# Patient Record
Sex: Female | Born: 1940 | Race: White | Hispanic: No | Marital: Married | Smoking: Former smoker
Health system: Southern US, Community
[De-identification: ages and names within clinical notes are randomized; demographics above are authoritative.]

## PROBLEM LIST (undated history)

## (undated) DIAGNOSIS — C801 Malignant (primary) neoplasm, unspecified: Secondary | ICD-10-CM

## (undated) DIAGNOSIS — Z87442 Personal history of urinary calculi: Secondary | ICD-10-CM

## (undated) DIAGNOSIS — M1990 Unspecified osteoarthritis, unspecified site: Secondary | ICD-10-CM

## (undated) DIAGNOSIS — K5790 Diverticulosis of intestine, part unspecified, without perforation or abscess without bleeding: Secondary | ICD-10-CM

## (undated) DIAGNOSIS — A4902 Methicillin resistant Staphylococcus aureus infection, unspecified site: Secondary | ICD-10-CM

## (undated) DIAGNOSIS — I499 Cardiac arrhythmia, unspecified: Secondary | ICD-10-CM

## (undated) DIAGNOSIS — N189 Chronic kidney disease, unspecified: Secondary | ICD-10-CM

## (undated) DIAGNOSIS — E7800 Pure hypercholesterolemia, unspecified: Secondary | ICD-10-CM

## (undated) DIAGNOSIS — K219 Gastro-esophageal reflux disease without esophagitis: Secondary | ICD-10-CM

## (undated) DIAGNOSIS — J302 Other seasonal allergic rhinitis: Secondary | ICD-10-CM

## (undated) DIAGNOSIS — Z973 Presence of spectacles and contact lenses: Secondary | ICD-10-CM

## (undated) DIAGNOSIS — I10 Essential (primary) hypertension: Secondary | ICD-10-CM

## (undated) DIAGNOSIS — Z972 Presence of dental prosthetic device (complete) (partial): Secondary | ICD-10-CM

## (undated) DIAGNOSIS — L0390 Cellulitis, unspecified: Secondary | ICD-10-CM

## (undated) DIAGNOSIS — M810 Age-related osteoporosis without current pathological fracture: Secondary | ICD-10-CM

## (undated) HISTORY — PX: COLONOSCOPY: SHX174

## (undated) HISTORY — PX: ABDOMINAL HYSTERECTOMY: SHX81

## (undated) HISTORY — PX: VEIN LIGATION: SHX2652

## (undated) HISTORY — PX: SKIN CANCER EXCISION: SHX779

## (undated) HISTORY — PX: CHOLECYSTECTOMY: SHX55

## (undated) HISTORY — PX: FOOT SURGERY: SHX648

---

## 1983-06-20 HISTORY — PX: BREAST BIOPSY: SHX20

## 1991-06-20 HISTORY — PX: BREAST CYST ASPIRATION: SHX578

## 2004-05-16 ENCOUNTER — Ambulatory Visit: Payer: Self-pay | Admitting: Internal Medicine

## 2004-11-18 ENCOUNTER — Ambulatory Visit: Payer: Self-pay | Admitting: Internal Medicine

## 2005-04-07 ENCOUNTER — Inpatient Hospital Stay: Payer: Self-pay | Admitting: Urology

## 2005-04-07 ENCOUNTER — Ambulatory Visit: Payer: Self-pay | Admitting: Urology

## 2005-04-25 ENCOUNTER — Ambulatory Visit: Payer: Self-pay | Admitting: Urology

## 2005-05-30 ENCOUNTER — Ambulatory Visit: Payer: Self-pay | Admitting: Internal Medicine

## 2005-06-19 DIAGNOSIS — C801 Malignant (primary) neoplasm, unspecified: Secondary | ICD-10-CM

## 2005-06-19 HISTORY — PX: NEPHRECTOMY: SHX65

## 2005-06-19 HISTORY — DX: Malignant (primary) neoplasm, unspecified: C80.1

## 2005-09-05 ENCOUNTER — Ambulatory Visit: Payer: Self-pay | Admitting: Internal Medicine

## 2005-12-15 ENCOUNTER — Ambulatory Visit: Payer: Self-pay | Admitting: Obstetrics and Gynecology

## 2006-06-04 ENCOUNTER — Ambulatory Visit: Payer: Self-pay | Admitting: Internal Medicine

## 2007-06-21 ENCOUNTER — Ambulatory Visit: Payer: Self-pay | Admitting: Internal Medicine

## 2007-06-27 ENCOUNTER — Ambulatory Visit: Payer: Self-pay | Admitting: Unknown Physician Specialty

## 2007-07-11 ENCOUNTER — Ambulatory Visit: Payer: Self-pay | Admitting: Internal Medicine

## 2008-07-14 ENCOUNTER — Ambulatory Visit: Payer: Self-pay | Admitting: Internal Medicine

## 2008-10-22 ENCOUNTER — Ambulatory Visit: Payer: Self-pay | Admitting: Internal Medicine

## 2009-03-03 ENCOUNTER — Inpatient Hospital Stay: Payer: Self-pay | Admitting: Podiatry

## 2009-03-03 DIAGNOSIS — A4902 Methicillin resistant Staphylococcus aureus infection, unspecified site: Secondary | ICD-10-CM

## 2009-03-03 HISTORY — DX: Methicillin resistant Staphylococcus aureus infection, unspecified site: A49.02

## 2009-07-15 ENCOUNTER — Ambulatory Visit: Payer: Self-pay | Admitting: Internal Medicine

## 2010-05-24 ENCOUNTER — Ambulatory Visit: Payer: Self-pay | Admitting: Internal Medicine

## 2010-07-26 ENCOUNTER — Ambulatory Visit: Payer: Self-pay | Admitting: Internal Medicine

## 2010-12-27 ENCOUNTER — Ambulatory Visit: Payer: Self-pay | Admitting: Unknown Physician Specialty

## 2011-08-03 ENCOUNTER — Ambulatory Visit: Payer: Self-pay | Admitting: Family Medicine

## 2012-08-29 ENCOUNTER — Ambulatory Visit: Payer: Self-pay | Admitting: Family Medicine

## 2013-04-28 DIAGNOSIS — C439 Malignant melanoma of skin, unspecified: Secondary | ICD-10-CM | POA: Insufficient documentation

## 2013-10-28 ENCOUNTER — Ambulatory Visit: Payer: Self-pay | Admitting: Family Medicine

## 2014-08-28 DIAGNOSIS — Z85820 Personal history of malignant melanoma of skin: Secondary | ICD-10-CM | POA: Insufficient documentation

## 2014-12-10 ENCOUNTER — Other Ambulatory Visit: Payer: Self-pay | Admitting: Family Medicine

## 2014-12-10 DIAGNOSIS — Z1231 Encounter for screening mammogram for malignant neoplasm of breast: Secondary | ICD-10-CM

## 2014-12-17 ENCOUNTER — Ambulatory Visit: Payer: Medicare Other

## 2014-12-17 ENCOUNTER — Ambulatory Visit
Admission: RE | Admit: 2014-12-17 | Discharge: 2014-12-17 | Disposition: A | Discharge status: Home / Self Care | Payer: Medicare Other | Source: Ambulatory Visit | Attending: Family Medicine | Admitting: Family Medicine

## 2014-12-17 DIAGNOSIS — Z1231 Encounter for screening mammogram for malignant neoplasm of breast: Secondary | ICD-10-CM | POA: Insufficient documentation

## 2014-12-17 HISTORY — DX: Malignant (primary) neoplasm, unspecified: C80.1

## 2015-01-29 ENCOUNTER — Encounter: Payer: Self-pay | Admitting: *Deleted

## 2015-02-02 NOTE — Discharge Instructions (Signed)

## 2015-02-03 ENCOUNTER — Ambulatory Visit
Admission: RE | Admit: 2015-02-03 | Discharge: 2015-02-03 | Disposition: A | Discharge status: Home / Self Care | Payer: Medicare Other | Source: Ambulatory Visit | Attending: Ophthalmology | Admitting: Ophthalmology

## 2015-02-03 ENCOUNTER — Ambulatory Visit: Payer: Medicare Other | Admitting: Anesthesiology

## 2015-02-03 ENCOUNTER — Encounter
Admission: RE | Disposition: A | Discharge status: Home / Self Care | Payer: Self-pay | Source: Ambulatory Visit | Attending: Ophthalmology

## 2015-02-03 DIAGNOSIS — Z85828 Personal history of other malignant neoplasm of skin: Secondary | ICD-10-CM | POA: Insufficient documentation

## 2015-02-03 DIAGNOSIS — H2512 Age-related nuclear cataract, left eye: Secondary | ICD-10-CM | POA: Insufficient documentation

## 2015-02-03 DIAGNOSIS — Z90710 Acquired absence of both cervix and uterus: Secondary | ICD-10-CM | POA: Diagnosis not present

## 2015-02-03 DIAGNOSIS — E780 Pure hypercholesterolemia: Secondary | ICD-10-CM | POA: Insufficient documentation

## 2015-02-03 DIAGNOSIS — Z905 Acquired absence of kidney: Secondary | ICD-10-CM | POA: Diagnosis not present

## 2015-02-03 DIAGNOSIS — J40 Bronchitis, not specified as acute or chronic: Secondary | ICD-10-CM | POA: Insufficient documentation

## 2015-02-03 DIAGNOSIS — Z881 Allergy status to other antibiotic agents status: Secondary | ICD-10-CM | POA: Diagnosis not present

## 2015-02-03 DIAGNOSIS — K5790 Diverticulosis of intestine, part unspecified, without perforation or abscess without bleeding: Secondary | ICD-10-CM | POA: Insufficient documentation

## 2015-02-03 DIAGNOSIS — M810 Age-related osteoporosis without current pathological fracture: Secondary | ICD-10-CM | POA: Insufficient documentation

## 2015-02-03 DIAGNOSIS — Z888 Allergy status to other drugs, medicaments and biological substances status: Secondary | ICD-10-CM | POA: Insufficient documentation

## 2015-02-03 DIAGNOSIS — Z85528 Personal history of other malignant neoplasm of kidney: Secondary | ICD-10-CM | POA: Diagnosis not present

## 2015-02-03 DIAGNOSIS — K219 Gastro-esophageal reflux disease without esophagitis: Secondary | ICD-10-CM | POA: Insufficient documentation

## 2015-02-03 HISTORY — DX: Gastro-esophageal reflux disease without esophagitis: K21.9

## 2015-02-03 HISTORY — PX: CATARACT EXTRACTION W/PHACO: SHX586

## 2015-02-03 HISTORY — DX: Age-related osteoporosis without current pathological fracture: M81.0

## 2015-02-03 HISTORY — DX: Pure hypercholesterolemia, unspecified: E78.00

## 2015-02-03 HISTORY — DX: Methicillin resistant Staphylococcus aureus infection, unspecified site: A49.02

## 2015-02-03 HISTORY — DX: Presence of spectacles and contact lenses: Z97.3

## 2015-02-03 HISTORY — DX: Essential (primary) hypertension: I10

## 2015-02-03 HISTORY — DX: Presence of dental prosthetic device (complete) (partial): Z97.2

## 2015-02-03 HISTORY — DX: Diverticulosis of intestine, part unspecified, without perforation or abscess without bleeding: K57.90

## 2015-02-03 HISTORY — DX: Other seasonal allergic rhinitis: J30.2

## 2015-02-03 HISTORY — DX: Unspecified osteoarthritis, unspecified site: M19.90

## 2015-02-03 SURGERY — PHACOEMULSIFICATION, CATARACT, WITH IOL INSERTION
Anesthesia: Monitor Anesthesia Care | Laterality: Left | Wound class: Clean

## 2015-02-03 MED ORDER — LIDOCAINE HCL 2 % EX GEL
1.0000 "application " | Freq: Once | CUTANEOUS | Status: AC
  Administered 2015-02-03: 5 via TOPICAL

## 2015-02-03 MED ORDER — NA HYALUR & NA CHOND-NA HYALUR 0.4-0.35 ML IO KIT
PACK | INTRAOCULAR | Status: DC | PRN
  Administered 2015-02-03: 1 mL via INTRAOCULAR

## 2015-02-03 MED ORDER — CEFUROXIME OPHTHALMIC INJECTION 1 MG/0.1 ML
INJECTION | OPHTHALMIC | Status: DC | PRN
  Administered 2015-02-03: 0.1 mL via OPHTHALMIC

## 2015-02-03 MED ORDER — LACTATED RINGERS IV SOLN: INTRAVENOUS | Status: DC

## 2015-02-03 MED ORDER — TIMOLOL MALEATE 0.5 % OP SOLN
OPHTHALMIC | Status: DC | PRN
  Administered 2015-02-03: 1 [drp] via OPHTHALMIC

## 2015-02-03 MED ORDER — POLYMYXIN B-TRIMETHOPRIM 10000-0.1 UNIT/ML-% OP SOLN
1.0000 [drp] | OPHTHALMIC | Status: DC | PRN
  Administered 2015-02-03 (×3): 1 [drp] via OPHTHALMIC

## 2015-02-03 MED ORDER — LIDOCAINE HCL (PF) 4 % IJ SOLN
INTRAMUSCULAR | Status: DC | PRN
  Administered 2015-02-03 (×2): 1 mL via OPHTHALMIC

## 2015-02-03 MED ORDER — BRIMONIDINE TARTRATE 0.2 % OP SOLN
OPHTHALMIC | Status: DC | PRN
  Administered 2015-02-03: 1 [drp] via OPHTHALMIC

## 2015-02-03 MED ORDER — EPINEPHRINE HCL 1 MG/ML IJ SOLN
INTRAOCULAR | Status: DC | PRN
  Administered 2015-02-03: 78 mL via OPHTHALMIC

## 2015-02-03 MED ORDER — POVIDONE-IODINE 5 % OP SOLN
1.0000 "application " | OPHTHALMIC | Status: DC | PRN
  Administered 2015-02-03: 1 via OPHTHALMIC

## 2015-02-03 MED ORDER — PHENYLEPHRINE HCL 10 % OP SOLN
1.0000 [drp] | OPHTHALMIC | Status: DC | PRN
  Administered 2015-02-03 (×4): 1 [drp] via OPHTHALMIC

## 2015-02-03 MED ORDER — ACETAMINOPHEN 160 MG/5ML PO SOLN: 325.0000 mg | ORAL | Status: DC | PRN

## 2015-02-03 MED ORDER — ACETAMINOPHEN 325 MG PO TABS: 325.0000 mg | ORAL_TABLET | ORAL | Status: DC | PRN

## 2015-02-03 MED ORDER — MIDAZOLAM HCL 2 MG/2ML IJ SOLN
INTRAMUSCULAR | Status: DC | PRN
  Administered 2015-02-03: 2 mg via INTRAVENOUS

## 2015-02-03 MED ORDER — TETRACAINE HCL 0.5 % OP SOLN
1.0000 [drp] | OPHTHALMIC | Status: DC | PRN
  Administered 2015-02-03: 1 [drp] via OPHTHALMIC

## 2015-02-03 MED ORDER — CYCLOPENTOLATE HCL 2 % OP SOLN
1.0000 [drp] | OPHTHALMIC | Status: DC | PRN
  Administered 2015-02-03 (×4): 1 [drp] via OPHTHALMIC

## 2015-02-03 MED ORDER — FENTANYL CITRATE (PF) 100 MCG/2ML IJ SOLN
INTRAMUSCULAR | Status: DC | PRN
  Administered 2015-02-03: 50 ug via INTRAVENOUS

## 2015-02-03 SURGICAL SUPPLY — 25 items
CANNULA ANT/CHMB 27GA (MISCELLANEOUS) ×3 IMPLANT
GLOVE SURG LX 7.5 STRW (GLOVE) ×2
GLOVE SURG LX STRL 7.5 STRW (GLOVE) ×1 IMPLANT
GLOVE SURG TRIUMPH 8.0 PF LTX (GLOVE) ×3 IMPLANT
GOWN STRL REUS W/ TWL LRG LVL3 (GOWN DISPOSABLE) ×2 IMPLANT
GOWN STRL REUS W/TWL LRG LVL3 (GOWN DISPOSABLE) ×4
LENS IOL TECNIS 12.5 (Intraocular Lens) ×3 IMPLANT
MARKER SKIN SURG W/RULER VIO (MISCELLANEOUS) ×3 IMPLANT
NDL RETROBULBAR .5 NSTRL (NEEDLE) IMPLANT
NEEDLE FILTER BLUNT 18X 1/2SAF (NEEDLE) ×2
NEEDLE FILTER BLUNT 18X1 1/2 (NEEDLE) ×1 IMPLANT
PACK CATARACT BRASINGTON (MISCELLANEOUS) ×3 IMPLANT
PACK EYE AFTER SURG (MISCELLANEOUS) ×3 IMPLANT
PACK OPTHALMIC (MISCELLANEOUS) ×3 IMPLANT
RING MALYGIN 7.0 (MISCELLANEOUS) IMPLANT
SUT ETHILON 10-0 CS-B-6CS-B-6 (SUTURE)
SUT VICRYL  9 0 (SUTURE)
SUT VICRYL 9 0 (SUTURE) IMPLANT
SUTURE EHLN 10-0 CS-B-6CS-B-6 (SUTURE) IMPLANT
SYR 3ML LL SCALE MARK (SYRINGE) ×3 IMPLANT
SYR 5ML LL (SYRINGE) IMPLANT
SYR TB 1ML LUER SLIP (SYRINGE) ×3 IMPLANT
WATER STERILE IRR 250ML POUR (IV SOLUTION) ×3 IMPLANT
WATER STERILE IRR 500ML POUR (IV SOLUTION) IMPLANT
WIPE NON LINTING 3.25X3.25 (MISCELLANEOUS) ×3 IMPLANT

## 2015-02-03 NOTE — H&P (Signed)
The History and Physical notes were scanned in.  The patient remains stable and unchanged from the H&P.   Previous H&P reviewed, patient examined, and there are no changes.  Shawnell Dykes 02/03/2015 12:02 PM

## 2015-02-03 NOTE — Addendum Note (Signed)
Addendum  created 02/03/15 1216 by Ronelle Nigh, MD   Modules edited: Notes Section   Notes Section:  File: [id number]

## 2015-02-03 NOTE — Op Note (Signed)
OPERATIVE NOTE  Carolyn Frye [id number] 02/03/2015   PREOPERATIVE DIAGNOSIS:  Nuclear sclerotic cataract left eye. H25.12   POSTOPERATIVE DIAGNOSIS:    Nuclear sclerotic cataract left eye.     PROCEDURE:  Phacoemusification with posterior chamber intraocular lens placement of the left eye   LENS:   Implant Name Type Inv. Item Serial No. Manufacturer Lot No. LRB No. Used  LENS IMPL INTRAOC ZCB00 12.5 - [id number] Intraocular Lens LENS IMPL INTRAOC ZCB00 12.5 [id number] AMO   Left 1        ULTRASOUND TIME: 10  % of 1 minutes 5 seconds, CDE 6.7  SURGEON:  Wyonia Hough, MD   ANESTHESIA:  Topical with tetracaine drops and 2% Xylocaine jelly, augmented with 1% preservative-free intracameral lidocaine.    COMPLICATIONS:  None.   DESCRIPTION OF PROCEDURE:  The patient was identified in the holding room and transported to the operating room and placed in the supine position under the operating microscope.  The left eye was identified as the operative eye and it was prepped and draped in the usual sterile ophthalmic fashion.   A 1 millimeter clear-corneal paracentesis was made at the 1:30 position.  0.5 ml of preservative-free 1% lidocaine was injected into the anterior chamber. The anterior chamber was filled with Viscoat viscoelastic.  A 2.4 millimeter keratome was used to make a near-clear corneal incision at the 10:30 position.  .  A curvilinear capsulorrhexis was made with a cystotome and capsulorrhexis forceps.  Balanced salt solution was used to hydrodissect and hydrodelineate the nucleus.   Phacoemulsification was then used in stop and chop fashion to remove the lens nucleus and epinucleus.  The remaining cortex was then removed using the irrigation and aspiration handpiece. Provisc was then placed into the capsular bag to distend it for lens placement.  A lens was then injected into the capsular bag.  The remaining viscoelastic was aspirated.   Wounds were hydrated  with balanced salt solution.  The anterior chamber was inflated to a physiologic pressure with balanced salt solution.  No wound leaks were noted. Cefuroxime 0.1 ml of a 10mg /ml solution was injected into the anterior chamber for a dose of 1 mg of intracameral antibiotic at the completion of the case.   Timolol and Brimonidine drops were applied to the eye.  The patient was taken to the recovery room in stable condition without complications of anesthesia or surgery.  [name] 02/03/2015, 11:32 AM

## 2015-02-03 NOTE — Transfer of Care (Signed)
Immediate Anesthesia Transfer of Care Note  Patient: Carolyn Frye  Procedure(s) Performed: Procedure(s): CATARACT EXTRACTION PHACO AND INTRAOCULAR LENS PLACEMENT (IOC) (Left)  Patient Location: PACU  Anesthesia Type: MAC  Level of Consciousness: awake, alert  and patient cooperative  Airway and Oxygen Therapy: Patient Spontanous Breathing and Patient connected to supplemental oxygen  Post-op Assessment: Post-op Vital signs reviewed, Patient's Cardiovascular Status Stable, Respiratory Function Stable, Patent Airway and No signs of Nausea or vomiting  Post-op Vital Signs: Reviewed and stable  Complications: No apparent anesthesia complications

## 2015-02-03 NOTE — Anesthesia Postprocedure Evaluation (Addendum)
  Anesthesia Post-op Note  Patient: Carolyn Frye  Procedure(s) Performed: Procedure(s): CATARACT EXTRACTION PHACO AND INTRAOCULAR LENS PLACEMENT (IOC) (Left)  Anesthesia type:MAC  Patient location: PACU  Post pain: Pain level controlled  Post assessment: Post-op Vital signs reviewed, Patient's Cardiovascular Status Stable, Respiratory Function Stable, Patent Airway and No signs of Nausea or vomiting  Post vital signs: Reviewed and stable  Last Vitals:  Filed Vitals:   02/03/15 1140  BP: 122/71  Pulse: 74  Temp:   Resp: 16    Level of consciousness: awake, alert  and patient cooperative  Complications: No apparent anesthesia complications

## 2015-02-03 NOTE — Anesthesia Procedure Notes (Signed)
Procedure Name: MAC Performed by: Star Resler Pre-anesthesia Checklist: Patient identified, Emergency Drugs available, Suction available, Timeout performed and Patient being monitored Patient Re-evaluated:Patient Re-evaluated prior to inductionOxygen Delivery Method: Nasal cannula Placement Confirmation: positive ETCO2       

## 2015-02-03 NOTE — Anesthesia Preprocedure Evaluation (Signed)
Anesthesia Evaluation  Patient identified by MRN, date of birth, ID band  Reviewed: Allergy & Precautions, H&P , NPO status , Patient's Chart, lab work & pertinent test results  Airway Mallampati: III  TM Distance: >3 FB Neck ROM: full    Dental no notable dental hx.    Pulmonary    Pulmonary exam normal       Cardiovascular hypertension, Rhythm:regular Rate:Normal     Neuro/Psych    GI/Hepatic GERD-  ,  Endo/Other    Renal/GU      Musculoskeletal   Abdominal   Peds  Hematology   Anesthesia Other Findings   Reproductive/Obstetrics                             Anesthesia Physical Anesthesia Plan  ASA: II  Anesthesia Plan: MAC   Post-op Pain Management:    Induction:   Airway Management Planned:   Additional Equipment:   Intra-op Plan:   Post-operative Plan:   Informed Consent: I have reviewed the patients History and Physical, chart, labs and discussed the procedure including the risks, benefits and alternatives for the proposed anesthesia with the patient or authorized representative who has indicated his/her understanding and acceptance.     Plan Discussed with: CRNA  Anesthesia Plan Comments:         Anesthesia Quick Evaluation

## 2015-02-04 ENCOUNTER — Encounter: Payer: Self-pay | Admitting: Ophthalmology

## 2015-03-04 ENCOUNTER — Encounter: Payer: Self-pay | Admitting: *Deleted

## 2015-03-09 NOTE — Discharge Instructions (Signed)

## 2015-03-10 ENCOUNTER — Ambulatory Visit
Admission: RE | Admit: 2015-03-10 | Discharge: 2015-03-10 | Disposition: A | Discharge status: Home / Self Care | Payer: Medicare Other | Source: Ambulatory Visit | Attending: Ophthalmology | Admitting: Ophthalmology

## 2015-03-10 ENCOUNTER — Ambulatory Visit: Payer: Medicare Other | Admitting: Student in an Organized Health Care Education/Training Program

## 2015-03-10 ENCOUNTER — Encounter: Payer: Self-pay | Admitting: *Deleted

## 2015-03-10 ENCOUNTER — Encounter
Admission: RE | Disposition: A | Discharge status: Home / Self Care | Payer: Self-pay | Source: Ambulatory Visit | Attending: Ophthalmology

## 2015-03-10 DIAGNOSIS — Z9049 Acquired absence of other specified parts of digestive tract: Secondary | ICD-10-CM | POA: Insufficient documentation

## 2015-03-10 DIAGNOSIS — K219 Gastro-esophageal reflux disease without esophagitis: Secondary | ICD-10-CM | POA: Diagnosis not present

## 2015-03-10 DIAGNOSIS — K5790 Diverticulosis of intestine, part unspecified, without perforation or abscess without bleeding: Secondary | ICD-10-CM | POA: Diagnosis not present

## 2015-03-10 DIAGNOSIS — Z905 Acquired absence of kidney: Secondary | ICD-10-CM | POA: Diagnosis not present

## 2015-03-10 DIAGNOSIS — Z85528 Personal history of other malignant neoplasm of kidney: Secondary | ICD-10-CM | POA: Insufficient documentation

## 2015-03-10 DIAGNOSIS — M1990 Unspecified osteoarthritis, unspecified site: Secondary | ICD-10-CM | POA: Insufficient documentation

## 2015-03-10 DIAGNOSIS — M810 Age-related osteoporosis without current pathological fracture: Secondary | ICD-10-CM | POA: Diagnosis not present

## 2015-03-10 DIAGNOSIS — Z79899 Other long term (current) drug therapy: Secondary | ICD-10-CM | POA: Insufficient documentation

## 2015-03-10 DIAGNOSIS — J40 Bronchitis, not specified as acute or chronic: Secondary | ICD-10-CM | POA: Diagnosis not present

## 2015-03-10 DIAGNOSIS — H2511 Age-related nuclear cataract, right eye: Secondary | ICD-10-CM | POA: Insufficient documentation

## 2015-03-10 DIAGNOSIS — Z85820 Personal history of malignant melanoma of skin: Secondary | ICD-10-CM | POA: Insufficient documentation

## 2015-03-10 DIAGNOSIS — Z888 Allergy status to other drugs, medicaments and biological substances status: Secondary | ICD-10-CM | POA: Diagnosis not present

## 2015-03-10 DIAGNOSIS — Z881 Allergy status to other antibiotic agents status: Secondary | ICD-10-CM | POA: Diagnosis not present

## 2015-03-10 DIAGNOSIS — E780 Pure hypercholesterolemia: Secondary | ICD-10-CM | POA: Insufficient documentation

## 2015-03-10 DIAGNOSIS — Z90710 Acquired absence of both cervix and uterus: Secondary | ICD-10-CM | POA: Insufficient documentation

## 2015-03-10 DIAGNOSIS — I10 Essential (primary) hypertension: Secondary | ICD-10-CM | POA: Insufficient documentation

## 2015-03-10 HISTORY — PX: CATARACT EXTRACTION W/PHACO: SHX586

## 2015-03-10 SURGERY — PHACOEMULSIFICATION, CATARACT, WITH IOL INSERTION
Anesthesia: Monitor Anesthesia Care | Laterality: Right | Wound class: Clean

## 2015-03-10 MED ORDER — DEXAMETHASONE SODIUM PHOSPHATE 4 MG/ML IJ SOLN: 8.0000 mg | Freq: Once | INTRAMUSCULAR | Status: DC | PRN

## 2015-03-10 MED ORDER — POVIDONE-IODINE 5 % OP SOLN
1.0000 "application " | OPHTHALMIC | Status: DC | PRN
  Administered 2015-03-10: 1 via OPHTHALMIC

## 2015-03-10 MED ORDER — ACETAMINOPHEN 325 MG PO TABS: 325.0000 mg | ORAL_TABLET | ORAL | Status: DC | PRN

## 2015-03-10 MED ORDER — LIDOCAINE HCL 2 % EX GEL
1.0000 "application " | Freq: Once | CUTANEOUS | Status: AC
  Administered 2015-03-10: 1 via TOPICAL

## 2015-03-10 MED ORDER — BRIMONIDINE TARTRATE 0.2 % OP SOLN
OPHTHALMIC | Status: DC | PRN
  Administered 2015-03-10: 1 [drp] via OPHTHALMIC

## 2015-03-10 MED ORDER — NA HYALUR & NA CHOND-NA HYALUR 0.4-0.35 ML IO KIT
PACK | INTRAOCULAR | Status: DC | PRN
  Administered 2015-03-10: 1 mL via INTRAOCULAR

## 2015-03-10 MED ORDER — CYCLOPENTOLATE HCL 2 % OP SOLN
1.0000 [drp] | OPHTHALMIC | Status: DC | PRN
  Administered 2015-03-10 (×4): 1 [drp] via OPHTHALMIC

## 2015-03-10 MED ORDER — PHENYLEPHRINE HCL 10 % OP SOLN
1.0000 [drp] | OPHTHALMIC | Status: DC | PRN
  Administered 2015-03-10 (×4): 1 [drp] via OPHTHALMIC

## 2015-03-10 MED ORDER — ACETAMINOPHEN 160 MG/5ML PO SOLN: 325.0000 mg | ORAL | Status: DC | PRN

## 2015-03-10 MED ORDER — CEFUROXIME OPHTHALMIC INJECTION 1 MG/0.1 ML
INJECTION | OPHTHALMIC | Status: DC | PRN
  Administered 2015-03-10: 0.1 mL via INTRACAMERAL

## 2015-03-10 MED ORDER — TIMOLOL MALEATE 0.5 % OP SOLN
OPHTHALMIC | Status: DC | PRN
  Administered 2015-03-10: 1 [drp] via OPHTHALMIC

## 2015-03-10 MED ORDER — PROPARACAINE HCL 0.5 % OP SOLN
1.0000 [drp] | Freq: Once | OPHTHALMIC | Status: AC
  Administered 2015-03-10: 1 [drp] via OPHTHALMIC

## 2015-03-10 MED ORDER — LACTATED RINGERS IV SOLN: 500.0000 mL | INTRAVENOUS | Status: DC

## 2015-03-10 MED ORDER — BALANCED SALT IO SOLN
INTRAOCULAR | Status: DC | PRN
  Administered 2015-03-10: .5 mL via OPHTHALMIC

## 2015-03-10 MED ORDER — FENTANYL CITRATE (PF) 100 MCG/2ML IJ SOLN
INTRAMUSCULAR | Status: DC | PRN
  Administered 2015-03-10: 100 ug via INTRAVENOUS

## 2015-03-10 MED ORDER — MIDAZOLAM HCL 2 MG/2ML IJ SOLN
INTRAMUSCULAR | Status: DC | PRN
  Administered 2015-03-10: 2 mg via INTRAVENOUS

## 2015-03-10 MED ORDER — LACTATED RINGERS IV SOLN: INTRAVENOUS | Status: DC

## 2015-03-10 MED ORDER — POLYMYXIN B-TRIMETHOPRIM 10000-0.1 UNIT/ML-% OP SOLN
1.0000 [drp] | OPHTHALMIC | Status: DC | PRN
  Administered 2015-03-10 (×3): 1 [drp] via OPHTHALMIC

## 2015-03-10 MED ORDER — EPINEPHRINE HCL 1 MG/ML IJ SOLN
INTRAOCULAR | Status: DC | PRN
  Administered 2015-03-10: 75 mL via OPHTHALMIC

## 2015-03-10 SURGICAL SUPPLY — 26 items
CANNULA ANT/CHMB 27GA (MISCELLANEOUS) ×3 IMPLANT
GLOVE SURG LX 7.5 STRW (GLOVE) ×2
GLOVE SURG LX STRL 7.5 STRW (GLOVE) ×1 IMPLANT
GLOVE SURG TRIUMPH 8.0 PF LTX (GLOVE) ×3 IMPLANT
GOWN STRL REUS W/ TWL LRG LVL3 (GOWN DISPOSABLE) ×2 IMPLANT
GOWN STRL REUS W/TWL LRG LVL3 (GOWN DISPOSABLE) ×4
LENS IOL TECNIS 13.5 (Intraocular Lens) ×3 IMPLANT
LENS IOL TECNIS MONO 1P 13.5 (Intraocular Lens) ×1 IMPLANT
MARKER SKIN SURG W/RULER VIO (MISCELLANEOUS) ×3 IMPLANT
NDL RETROBULBAR .5 NSTRL (NEEDLE) IMPLANT
NEEDLE FILTER BLUNT 18X 1/2SAF (NEEDLE) ×2
NEEDLE FILTER BLUNT 18X1 1/2 (NEEDLE) ×1 IMPLANT
PACK CATARACT BRASINGTON (MISCELLANEOUS) ×3 IMPLANT
PACK EYE AFTER SURG (MISCELLANEOUS) ×3 IMPLANT
PACK OPTHALMIC (MISCELLANEOUS) ×3 IMPLANT
RING MALYGIN 7.0 (MISCELLANEOUS) IMPLANT
SUT ETHILON 10-0 CS-B-6CS-B-6 (SUTURE)
SUT VICRYL  9 0 (SUTURE)
SUT VICRYL 9 0 (SUTURE) IMPLANT
SUTURE EHLN 10-0 CS-B-6CS-B-6 (SUTURE) IMPLANT
SYR 3ML LL SCALE MARK (SYRINGE) ×3 IMPLANT
SYR 5ML LL (SYRINGE) IMPLANT
SYR TB 1ML LUER SLIP (SYRINGE) ×3 IMPLANT
WATER STERILE IRR 250ML POUR (IV SOLUTION) ×3 IMPLANT
WATER STERILE IRR 500ML POUR (IV SOLUTION) IMPLANT
WIPE NON LINTING 3.25X3.25 (MISCELLANEOUS) ×3 IMPLANT

## 2015-03-10 NOTE — Anesthesia Postprocedure Evaluation (Signed)
  Anesthesia Post-op Note  Patient: Carolyn Frye  Procedure(s) Performed: Procedure(s): CATARACT EXTRACTION PHACO AND INTRAOCULAR LENS PLACEMENT (IOC) (Right)  Anesthesia type:MAC  Patient location: PACU  Post pain: Pain level controlled  Post assessment: Post-op Vital signs reviewed, Patient's Cardiovascular Status Stable, Respiratory Function Stable, Patent Airway and No signs of Nausea or vomiting  Post vital signs: Reviewed and stable  Last Vitals:  Filed Vitals:   03/10/15 1044  BP:   Pulse: 65  Temp:   Resp: 17    Level of consciousness: awake, alert  and patient cooperative  Complications: No apparent anesthesia complications

## 2015-03-10 NOTE — H&P (Signed)
  The History and Physical notes were scanned in.  The patient remains stable and unchanged from the H&P.   Previous H&P reviewed, patient examined, and there are no changes.  Frye,Carolyn 03/10/2015 9:56 AM

## 2015-03-10 NOTE — Op Note (Signed)
LOCATION:  [place]   PREOPERATIVE DIAGNOSIS:    Nuclear sclerotic cataract right eye. H25.11   POSTOPERATIVE DIAGNOSIS:  Nuclear sclerotic cataract right eye.     PROCEDURE:  Phacoemusification with posterior chamber intraocular lens placement of the right eye   LENS:   Implant Name Type Inv. Item Serial No. Manufacturer Lot No. LRB No. Used  LENS IOL TECNIS 13.5 - [id number] Intraocular Lens LENS IOL TECNIS 13.5 [id number] AMO   Right 1        ULTRASOUND TIME: 11 % of 1 minutes, 36 seconds.  CDE 11.1   SURGEON:  Wyonia Hough, MD   ANESTHESIA:  Topical with tetracaine drops and 2% Xylocaine jelly, augmented with 1% preservative-free intracameral lidocaine.    COMPLICATIONS:  None.   DESCRIPTION OF PROCEDURE:  The patient was identified in the holding room and transported to the operating room and placed in the supine position under the operating microscope.  The right eye was identified as the operative eye and it was prepped and draped in the usual sterile ophthalmic fashion.   A 1 millimeter clear-corneal paracentesis was made at the 12:00 position.  0.5 ml of preservative-free 1% lidocaine was injected into the anterior chamber. The anterior chamber was filled with Viscoat viscoelastic.  A 2.4 millimeter keratome was used to make a near-clear corneal incision at the 9:00 position.  A curvilinear capsulorrhexis was made with a cystotome and capsulorrhexis forceps.  Balanced salt solution was used to hydrodissect and hydrodelineate the nucleus.   Phacoemulsification was then used in stop and chop fashion to remove the lens nucleus and epinucleus.  The remaining cortex was then removed using the irrigation and aspiration handpiece. Provisc was then placed into the capsular bag to distend it for lens placement.  A lens was then injected into the capsular bag.  The remaining viscoelastic was aspirated.   Wounds were hydrated with balanced salt solution.  The  anterior chamber was inflated to a physiologic pressure with balanced salt solution.  No wound leaks were noted. Cefuroxime 0.1 ml of a 10mg /ml solution was injected into the anterior chamber for a dose of 1 mg of intracameral antibiotic at the completion of the case.   Timolol and Brimonidine drops were applied to the eye.  The patient was taken to the recovery room in stable condition without complications of anesthesia or surgery.   BRASINGTON,CHADWICK 03/10/2015, 10:39 AM

## 2015-03-10 NOTE — Anesthesia Preprocedure Evaluation (Signed)
Anesthesia Evaluation  Patient identified by MRN, date of birth, ID band Patient awake    Reviewed: Allergy & Precautions, H&P , NPO status , Patient's Chart, lab work & pertinent test results, reviewed documented beta blocker date and time   Airway Mallampati: II  TM Distance: >3 FB Neck ROM: full    Dental no notable dental hx.    Pulmonary neg pulmonary ROS,    Pulmonary exam normal breath sounds clear to auscultation       Cardiovascular Exercise Tolerance: Good hypertension,  Rhythm:regular Rate:Normal     Neuro/Psych negative neurological ROS  negative psych ROS   GI/Hepatic Neg liver ROS, GERD  Medicated,  Endo/Other  negative endocrine ROS  Renal/GU negative Renal ROS  negative genitourinary   Musculoskeletal   Abdominal   Peds  Hematology negative hematology ROS (+)   Anesthesia Other Findings   Reproductive/Obstetrics negative OB ROS                             Anesthesia Physical Anesthesia Plan  ASA: II  Anesthesia Plan: MAC   Post-op Pain Management:    Induction:   Airway Management Planned:   Additional Equipment:   Intra-op Plan:   Post-operative Plan:   Informed Consent: I have reviewed the patients History and Physical, chart, labs and discussed the procedure including the risks, benefits and alternatives for the proposed anesthesia with the patient or authorized representative who has indicated his/her understanding and acceptance.     Plan Discussed with: CRNA  Anesthesia Plan Comments:         Anesthesia Quick Evaluation

## 2015-03-10 NOTE — Transfer of Care (Signed)
Immediate Anesthesia Transfer of Care Note  Patient: Carolyn Frye  Procedure(s) Performed: Procedure(s): CATARACT EXTRACTION PHACO AND INTRAOCULAR LENS PLACEMENT (IOC) (Right)  Patient Location: PACU  Anesthesia Type: MAC  Level of Consciousness: awake, alert  and patient cooperative  Airway and Oxygen Therapy: Patient Spontanous Breathing and Patient connected to supplemental oxygen  Post-op Assessment: Post-op Vital signs reviewed, Patient's Cardiovascular Status Stable, Respiratory Function Stable, Patent Airway and No signs of Nausea or vomiting  Post-op Vital Signs: Reviewed and stable  Complications: No apparent anesthesia complications

## 2015-03-10 NOTE — Anesthesia Procedure Notes (Signed)
Procedure Name: MAC Performed by: AMYOT, MICHAEL Pre-anesthesia Checklist: Patient identified, Emergency Drugs available, Suction available, Timeout performed and Patient being monitored Patient Re-evaluated:Patient Re-evaluated prior to inductionOxygen Delivery Method: Nasal cannula Placement Confirmation: positive ETCO2       

## 2015-03-11 ENCOUNTER — Encounter: Payer: Self-pay | Admitting: Ophthalmology

## 2016-03-02 ENCOUNTER — Other Ambulatory Visit: Payer: Self-pay | Admitting: Family Medicine

## 2016-03-02 DIAGNOSIS — Z1231 Encounter for screening mammogram for malignant neoplasm of breast: Secondary | ICD-10-CM

## 2016-03-09 ENCOUNTER — Other Ambulatory Visit: Payer: Self-pay | Admitting: Family Medicine

## 2016-03-09 ENCOUNTER — Ambulatory Visit
Admission: RE | Admit: 2016-03-09 | Discharge: 2016-03-09 | Disposition: A | Discharge status: Home / Self Care | Payer: Medicare Other | Source: Ambulatory Visit | Attending: Family Medicine | Admitting: Family Medicine

## 2016-03-09 DIAGNOSIS — Z1231 Encounter for screening mammogram for malignant neoplasm of breast: Secondary | ICD-10-CM

## 2016-09-04 DIAGNOSIS — Z0000 Encounter for general adult medical examination without abnormal findings: Secondary | ICD-10-CM | POA: Insufficient documentation

## 2016-09-21 ENCOUNTER — Emergency Department: Payer: Medicare Other

## 2016-09-21 ENCOUNTER — Encounter: Payer: Self-pay | Admitting: *Deleted

## 2016-09-21 ENCOUNTER — Inpatient Hospital Stay
Admission: EM | Admit: 2016-09-21 | Discharge: 2016-09-22 | DRG: 313 | Disposition: A | Discharge status: Home / Self Care | Payer: Medicare Other | Attending: Internal Medicine | Admitting: Internal Medicine

## 2016-09-21 ENCOUNTER — Inpatient Hospital Stay
Admit: 2016-09-21 | Discharge: 2016-09-21 | Disposition: A | Discharge status: Home / Self Care | Payer: Medicare Other | Attending: Internal Medicine | Admitting: Internal Medicine

## 2016-09-21 DIAGNOSIS — Z79899 Other long term (current) drug therapy: Secondary | ICD-10-CM

## 2016-09-21 DIAGNOSIS — Z9049 Acquired absence of other specified parts of digestive tract: Secondary | ICD-10-CM

## 2016-09-21 DIAGNOSIS — K219 Gastro-esophageal reflux disease without esophagitis: Secondary | ICD-10-CM | POA: Diagnosis not present

## 2016-09-21 DIAGNOSIS — Z85528 Personal history of other malignant neoplasm of kidney: Secondary | ICD-10-CM

## 2016-09-21 DIAGNOSIS — Z9842 Cataract extraction status, left eye: Secondary | ICD-10-CM

## 2016-09-21 DIAGNOSIS — M810 Age-related osteoporosis without current pathological fracture: Secondary | ICD-10-CM | POA: Diagnosis not present

## 2016-09-21 DIAGNOSIS — F419 Anxiety disorder, unspecified: Secondary | ICD-10-CM | POA: Diagnosis present

## 2016-09-21 DIAGNOSIS — Z803 Family history of malignant neoplasm of breast: Secondary | ICD-10-CM | POA: Diagnosis not present

## 2016-09-21 DIAGNOSIS — Z85820 Personal history of malignant melanoma of skin: Secondary | ICD-10-CM

## 2016-09-21 DIAGNOSIS — R079 Chest pain, unspecified: Secondary | ICD-10-CM | POA: Diagnosis present

## 2016-09-21 DIAGNOSIS — Z882 Allergy status to sulfonamides status: Secondary | ICD-10-CM

## 2016-09-21 DIAGNOSIS — Z888 Allergy status to other drugs, medicaments and biological substances status: Secondary | ICD-10-CM

## 2016-09-21 DIAGNOSIS — Z905 Acquired absence of kidney: Secondary | ICD-10-CM

## 2016-09-21 DIAGNOSIS — Z961 Presence of intraocular lens: Secondary | ICD-10-CM | POA: Diagnosis not present

## 2016-09-21 DIAGNOSIS — Z9841 Cataract extraction status, right eye: Secondary | ICD-10-CM | POA: Diagnosis not present

## 2016-09-21 DIAGNOSIS — I10 Essential (primary) hypertension: Secondary | ICD-10-CM | POA: Diagnosis not present

## 2016-09-21 DIAGNOSIS — R002 Palpitations: Secondary | ICD-10-CM | POA: Diagnosis not present

## 2016-09-21 DIAGNOSIS — I493 Ventricular premature depolarization: Secondary | ICD-10-CM | POA: Diagnosis present

## 2016-09-21 DIAGNOSIS — M1990 Unspecified osteoarthritis, unspecified site: Secondary | ICD-10-CM | POA: Diagnosis not present

## 2016-09-21 DIAGNOSIS — R0789 Other chest pain: Secondary | ICD-10-CM | POA: Diagnosis not present

## 2016-09-21 DIAGNOSIS — E782 Mixed hyperlipidemia: Secondary | ICD-10-CM | POA: Diagnosis present

## 2016-09-21 DIAGNOSIS — Z90710 Acquired absence of both cervix and uterus: Secondary | ICD-10-CM | POA: Diagnosis not present

## 2016-09-21 LAB — COMPREHENSIVE METABOLIC PANEL
ALK PHOS: 95 U/L (ref 38–126)
ALT: 15 U/L (ref 14–54)
AST: 23 U/L (ref 15–41)
Albumin: 4.3 g/dL (ref 3.5–5.0)
Anion gap: 10 (ref 5–15)
BILIRUBIN TOTAL: 0.7 mg/dL (ref 0.3–1.2)
BUN: 16 mg/dL (ref 6–20)
CALCIUM: 9.6 mg/dL (ref 8.9–10.3)
CO2: 25 mmol/L (ref 22–32)
CREATININE: 0.82 mg/dL (ref 0.44–1.00)
Chloride: 97 mmol/L — ABNORMAL LOW (ref 101–111)
Glucose, Bld: 105 mg/dL — ABNORMAL HIGH (ref 65–99)
Potassium: 3.7 mmol/L (ref 3.5–5.1)
Sodium: 132 mmol/L — ABNORMAL LOW (ref 135–145)
TOTAL PROTEIN: 7.4 g/dL (ref 6.5–8.1)

## 2016-09-21 LAB — ECHOCARDIOGRAM COMPLETE
HEIGHTINCHES: 66 in
WEIGHTICAEL: 2630.4 [oz_av]

## 2016-09-21 LAB — CBC
HEMATOCRIT: 46.6 % (ref 35.0–47.0)
HEMOGLOBIN: 15.8 g/dL (ref 12.0–16.0)
MCH: 28.4 pg (ref 26.0–34.0)
MCHC: 33.9 g/dL (ref 32.0–36.0)
MCV: 83.8 fL (ref 80.0–100.0)
PLATELETS: 239 10*3/uL (ref 150–440)
RBC: 5.56 MIL/uL — ABNORMAL HIGH (ref 3.80–5.20)
RDW: 15.1 % — ABNORMAL HIGH (ref 11.5–14.5)
WBC: 6.5 10*3/uL (ref 3.6–11.0)

## 2016-09-21 LAB — BRAIN NATRIURETIC PEPTIDE: B NATRIURETIC PEPTIDE 5: 35 pg/mL (ref 0.0–100.0)

## 2016-09-21 LAB — MAGNESIUM: Magnesium: 1.8 mg/dL (ref 1.7–2.4)

## 2016-09-21 LAB — TSH: TSH: 1.029 u[IU]/mL (ref 0.350–4.500)

## 2016-09-21 LAB — TROPONIN I
Troponin I: <0.03 ng/mL (ref <0.03)
Troponin I: <0.03 ng/mL (ref <0.03)

## 2016-09-21 MED ORDER — ACETAMINOPHEN 325 MG PO TABS
650.0000 mg | ORAL_TABLET | Freq: Four times a day (QID) | ORAL | Status: DC | PRN
  Administered 2016-09-22 (×2): 650 mg via ORAL
  Filled 2016-09-21: qty 2

## 2016-09-21 MED ORDER — AMLODIPINE BESYLATE 5 MG PO TABS
10.0000 mg | ORAL_TABLET | Freq: Every day | ORAL | Status: DC
  Administered 2016-09-22: 10 mg via ORAL
  Filled 2016-09-21: qty 2

## 2016-09-21 MED ORDER — SODIUM CHLORIDE 0.9 % IV SOLN
INTRAVENOUS | Status: DC
  Administered 2016-09-21 – 2016-09-22 (×2): via INTRAVENOUS

## 2016-09-21 MED ORDER — HYDROCHLOROTHIAZIDE 25 MG PO TABS
12.5000 mg | ORAL_TABLET | Freq: Every day | ORAL | Status: DC
  Administered 2016-09-22: 12.5 mg via ORAL
  Filled 2016-09-21: qty 1

## 2016-09-21 MED ORDER — ONDANSETRON HCL 4 MG PO TABS: 4.0000 mg | ORAL_TABLET | Freq: Four times a day (QID) | ORAL | Status: DC | PRN

## 2016-09-21 MED ORDER — HYDROCODONE-ACETAMINOPHEN 5-325 MG PO TABS: 1.0000 | ORAL_TABLET | ORAL | Status: DC | PRN

## 2016-09-21 MED ORDER — SODIUM CHLORIDE 0.9% FLUSH
3.0000 mL | Freq: Two times a day (BID) | INTRAVENOUS | Status: DC
  Administered 2016-09-21 – 2016-09-22 (×2): 3 mL via INTRAVENOUS

## 2016-09-21 MED ORDER — SODIUM CHLORIDE 0.9 % IV SOLN
1000.0000 mL | Freq: Once | INTRAVENOUS | Status: AC
  Administered 2016-09-21: 1000 mL via INTRAVENOUS

## 2016-09-21 MED ORDER — BISACODYL 5 MG PO TBEC: 5.0000 mg | DELAYED_RELEASE_TABLET | Freq: Every day | ORAL | Status: DC | PRN

## 2016-09-21 MED ORDER — ACETAMINOPHEN 650 MG RE SUPP: 650.0000 mg | Freq: Four times a day (QID) | RECTAL | Status: DC | PRN

## 2016-09-21 MED ORDER — ENOXAPARIN SODIUM 40 MG/0.4ML ~~LOC~~ SOLN
40.0000 mg | SUBCUTANEOUS | Status: DC
  Administered 2016-09-21: 40 mg via SUBCUTANEOUS
  Filled 2016-09-21: qty 0.4

## 2016-09-21 MED ORDER — HYDRALAZINE HCL 50 MG PO TABS
25.0000 mg | ORAL_TABLET | Freq: Three times a day (TID) | ORAL | Status: DC
  Administered 2016-09-21 – 2016-09-22 (×3): 25 mg via ORAL
  Filled 2016-09-21 (×2): qty 1
  Filled 2016-09-21: qty 2

## 2016-09-21 MED ORDER — LORATADINE 10 MG PO TABS: 10.0000 mg | ORAL_TABLET | Freq: Every day | ORAL | Status: DC | PRN

## 2016-09-21 MED ORDER — ONDANSETRON HCL 4 MG/2ML IJ SOLN: 4.0000 mg | Freq: Four times a day (QID) | INTRAMUSCULAR | Status: DC | PRN

## 2016-09-21 MED ORDER — PANTOPRAZOLE SODIUM 40 MG PO TBEC
40.0000 mg | DELAYED_RELEASE_TABLET | Freq: Every day | ORAL | Status: DC
  Administered 2016-09-22: 40 mg via ORAL
  Filled 2016-09-21: qty 1

## 2016-09-21 MED ORDER — VITAMIN D (ERGOCALCIFEROL) 1.25 MG (50000 UNIT) PO CAPS: 50000.0000 [IU] | ORAL_CAPSULE | ORAL | Status: DC

## 2016-09-21 MED ORDER — SENNOSIDES-DOCUSATE SODIUM 8.6-50 MG PO TABS: 1.0000 | ORAL_TABLET | Freq: Every evening | ORAL | Status: DC | PRN

## 2016-09-21 MED ORDER — ROSUVASTATIN CALCIUM 10 MG PO TABS
10.0000 mg | ORAL_TABLET | Freq: Every day | ORAL | Status: DC
  Administered 2016-09-22: 10 mg via ORAL
  Filled 2016-09-21: qty 1

## 2016-09-21 NOTE — ED Notes (Signed)
Pt resting in bed, husband at bedside, pt aware of pending admission

## 2016-09-21 NOTE — ED Notes (Signed)
Pt assisted to bathroom, and helped back to bed, lights dimmed, resp even and unlabored, husband at bedside

## 2016-09-21 NOTE — Plan of Care (Signed)
Problem: Education: Goal: Knowledge of [facility] Education information/materials will improve Outcome: Completed/Met Date Met: 09/21/16 Moderate fall risk  Pt informed to ask for assistance before getting of bed / verbalized an understanding/ pt belonging in reach/ instructed how to use phone and call bell   

## 2016-09-21 NOTE — Consult Note (Signed)
[facility] Cardiology Consultation Note  Patient ID: Carolyn Frye, MRN: [id number], DOB/AGE: [birth year] 76 y.o. Admit date: 09/21/2016   Date of Consult: 09/21/2016 Primary Physician: Dion Body, MD Primary Cardiologist: None  Chief Complaint:  Chief Complaint  Patient presents with  . Jaw Pain  . Chest Pain   Reason for Consult: palpitations chest pain weakness  HPI: 76 y.o. female with known essential hypertension mixed hyperlipidemia without evidence of previous cardiovascular disease having significant potential side effects of medication management with increase in palpitations weakness fatigue over the last several days to a week with and without stress. The patient was seen in the emergency room feeling somewhat ill but there was no EKG changes or myocardial infarction with a normal troponin. She did have multiple preventricular contractions which appeared to be benign in nature. Now she is feeling much better now that she has some hydration and no other issues. There has been no true major chest pain but although she does have some funny feelings in her left neck. The patient is improved is dramatically this evening  Past Medical History:  Diagnosis Date  . Arthritis    hands, feet  . Cancer [facility]) 2007   kidney  . Cancer ([place]) 2014   melanoma  . Diverticulosis   . GERD (gastroesophageal reflux disease)   . Hypercholesteremia   . Hypertension   . MRSA (methicillin resistant Staphylococcus aureus) 03/03/2009   foot wound - treated  . Osteoporosis   . Seasonal allergies   . Wears contact lenses   . Wears dentures    partial bottom      Surgical History:  Past Surgical History:  Procedure Laterality Date  . ABDOMINAL HYSTERECTOMY    . BREAST BIOPSY Left 1985   neg  . BREAST CYST ASPIRATION Left 1993   lt fna-neg  . CATARACT EXTRACTION W/PHACO Left 02/03/2015   Procedure: CATARACT EXTRACTION PHACO AND INTRAOCULAR LENS PLACEMENT (IOC);  Surgeon:  Leandrew Koyanagi, MD;  Location: [place];  Service: Ophthalmology;  Laterality: Left;  . CATARACT EXTRACTION W/PHACO Right 03/10/2015   Procedure: CATARACT EXTRACTION PHACO AND INTRAOCULAR LENS PLACEMENT (IOC);  Surgeon: Leandrew Koyanagi, MD;  Location: [place];  Service: Ophthalmology;  Laterality: Right;  . CHOLECYSTECTOMY    . COLONOSCOPY    . FOOT SURGERY     for MRSA  . NEPHRECTOMY Right 2007  . SKIN CANCER EXCISION    . VEIN LIGATION Right      Home Meds: Prior to Admission medications   Medication Sig Start Date End Date Taking? Authorizing Provider  amLODipine (NORVASC) 10 MG tablet Take 10 mg by mouth daily. AM   Yes Historical Provider, MD  ergocalciferol (VITAMIN D2) 50000 UNITS capsule Take 50,000 Units by mouth once a week.   Yes Historical Provider, MD  hydrALAZINE (APRESOLINE) 25 MG tablet Take 25 mg by mouth every 8 (eight) hours.  09/18/16  Yes Historical Provider, MD  hydrochlorothiazide (HYDRODIURIL) 12.5 MG tablet Take 12.5 mg by mouth daily. AM   Yes Historical Provider, MD  omeprazole (PRILOSEC) 20 MG capsule Take 20 mg by mouth daily. AM   Yes Historical Provider, MD  rosuvastatin (CRESTOR) 10 MG tablet Take 10 mg by mouth daily. AM   Yes Historical Provider, MD  loratadine (CLARITIN) 10 MG tablet Take 10 mg by mouth daily.    Historical Provider, MD    Inpatient Medications:  . [START ON 09/22/2016] amLODipine  10 mg Oral Daily  . enoxaparin (LOVENOX)  injection  40 mg Subcutaneous Q24H  . hydrALAZINE  25 mg Oral Q8H  . [START ON 09/22/2016] hydrochlorothiazide  12.5 mg Oral Daily  . [START ON 09/22/2016] pantoprazole  40 mg Oral Daily  . [START ON 09/22/2016] rosuvastatin  10 mg Oral Daily  . sodium chloride flush  3 mL Intravenous Q12H  . [START ON 09/25/2016] Vitamin D (Ergocalciferol)  50,000 Units Oral Q Mon   . sodium chloride 75 mL/hr at 09/21/16 1446    Allergies:  Allergies  Allergen Reactions  . Ciprofloxacin Other (See  Comments)    Shoulder pain, tendon tear  . Dilaudid [Hydromorphone] Other (See Comments)    Severe headache  . Macrobid [Nitrofurantoin] Other (See Comments)    Red, burning eyes  . Sulfa Antibiotics Other (See Comments)  . Levaquin [Levofloxacin] Anxiety    Social History   Social History  . Marital status: Married    Spouse name: N/A  . Number of children: N/A  . Years of education: N/A   Occupational History  . Not on file.   Social History Main Topics  . Smoking status: Never Smoker  . Smokeless tobacco: Not on file  . Alcohol use No  . Drug use: Unknown  . Sexual activity: Not on file   Other Topics Concern  . Not on file   Social History Narrative  . No narrative on file     Family History  Problem Relation Age of Onset  . Breast cancer Neg Hx      Review of Systems Positive for Palpitations irregular heart beat shortness of breath Negative for: General:  chills, fever, night sweats or weight changes.  Cardiovascular: PND orthopnea syncope dizziness  Dermatological skin lesions rashes Respiratory: Cough congestion Urologic: Frequent urination urination at night and hematuria Abdominal: negative for nausea, vomiting, diarrhea, bright red blood per rectum, melena, or hematemesis Neurologic: negative for visual changes, and/or hearing changes  All other systems reviewed and are otherwise negative except as noted above.  Labs:  Recent Labs  09/21/16 0959 09/21/16 1409  TROPONINI <0.03 <0.03   Lab Results  Component Value Date   WBC 6.5 09/21/2016   HGB 15.8 09/21/2016   HCT 46.6 09/21/2016   MCV 83.8 09/21/2016   PLT 239 09/21/2016    Recent Labs Lab 09/21/16 0959  NA 132*  K 3.7  CL 97*  CO2 25  BUN 16  CREATININE 0.82  CALCIUM 9.6  PROT 7.4  BILITOT 0.7  ALKPHOS 95  ALT 15  AST 23  GLUCOSE 105*   No results found for: CHOL, HDL, LDLCALC, TRIG No results found for: DDIMER  Radiology/Studies:  Dg Chest Portable 1  View  Result Date: 09/21/2016 CLINICAL DATA:  Patient with chest pain. EXAM: PORTABLE CHEST 1 VIEW COMPARISON:  Chest CT 05/24/2010 FINDINGS: Monitoring leads overlie the patient. Stable cardiomegaly. No consolidative pulmonary opacities. No pleural effusion or pneumothorax. IMPRESSION: Cardiomegaly.  No acute cardiopulmonary process. Electronically Signed   By: Lovey Newcomer M.D.   On: 09/21/2016 10:50    EKG: Normal sinus rhythm  Weights: Filed Weights   09/21/16 0956 09/21/16 1340  Weight: 73.9 kg (163 lb) 74.6 kg (164 lb 6.4 oz)     Physical Exam: Blood pressure (!) 144/80, pulse 91, temperature 98.2 F (36.8 C), temperature source Oral, resp. rate 14, height 5\' 6"  (1.676 m), weight 74.6 kg (164 lb 6.4 oz), SpO2 98 %. Body mass index is 26.53 kg/m. General: Well developed, well nourished, in no acute  distress. Head eyes ears nose throat: Normocephalic, atraumatic, sclera non-icteric, no xanthomas, nares are without discharge. No apparent thyromegaly and/or mass  Lungs: Normal respiratory effort.  no wheezes, no rales, no rhonchi.  Heart: RRR with normal S1 S2. no murmur gallop, no rub, PMI is normal size and placement, carotid upstroke normal without bruit, jugular venous pressure is normal Abdomen: Soft, non-tender, non-distended with normoactive bowel sounds. No hepatomegaly. No rebound/guarding. No obvious abdominal masses. Abdominal aorta is normal size without bruit Extremities: No edema. no cyanosis, no clubbing, no ulcers  Peripheral : 2+ bilateral upper extremity pulses, 2+ bilateral femoral pulses, 2+ bilateral dorsal pedal pulse Neuro: Alert and oriented. No facial asymmetry. No focal deficit. Moves all extremities spontaneously. Musculoskeletal: Normal muscle tone without kyphosis Psych:  Responds to questions appropriately with a normal affect.    Assessment: 76 year old female with essential hypertension makes hyperlipidemia with palpitations irregular heart beat and no  current evidence of myocardial infarction with a normal troponin and normal EKG  Plan: 1. Observation overnight following for any worsening symptoms 2. Echocardiogram for LV systolic dysfunction valvular heart disease contributing to above 3. Continue current medical regimen for hypertension control and possible outpatient adjustments if side effects are occurring 4. No change in high intensity cholesterol therapy for risk reduction cardiovascular event 5. Okay for discharge to home from cardiac standpoint with follow-up early next week for further adjustments of medication management and further treatment options 6. No further cardiac diagnostics necessary at this time  Signed, Corey Skains M.D. [facility] 09/21/2016, 6:19 PM

## 2016-09-21 NOTE — ED Provider Notes (Signed)
[facility] Emergency Department Provider Note   ____________________________________________    I have reviewed the triage vital signs and the nursing notes.   HISTORY  Chief Complaint Jaw Pain and Chest Pain     HPI Carolyn Frye is a 76 y.o. female who presents with complaints of shoulder discomfort, tingling in her left jaw and left arm. She also reports palpitations and dizziness. The symptoms started primarily yesterday evening and worsened this morning. She feels lightheaded and reports frequent "skipped beats". Denies nausea or vomiting. She does feel flushed. She is never had this before. Recently started hydralazine per PCP.   Past Medical History:  Diagnosis Date  . Arthritis    hands, feet  . Cancer [facility]) 2007   kidney  . Cancer ([place]) 2014   melanoma  . Diverticulosis   . GERD (gastroesophageal reflux disease)   . Hypercholesteremia   . Hypertension   . MRSA (methicillin resistant Staphylococcus aureus) 03/03/2009   foot wound - treated  . Osteoporosis   . Seasonal allergies   . Wears contact lenses   . Wears dentures    partial bottom    Patient Active Problem List   Diagnosis Date Noted  . Chest pain 09/21/2016    Past Surgical History:  Procedure Laterality Date  . ABDOMINAL HYSTERECTOMY    . BREAST BIOPSY Left 1985   neg  . BREAST CYST ASPIRATION Left 1993   lt fna-neg  . CATARACT EXTRACTION W/PHACO Left 02/03/2015   Procedure: CATARACT EXTRACTION PHACO AND INTRAOCULAR LENS PLACEMENT (IOC);  Surgeon: Leandrew Koyanagi, MD;  Location: [place];  Service: Ophthalmology;  Laterality: Left;  . CATARACT EXTRACTION W/PHACO Right 03/10/2015   Procedure: CATARACT EXTRACTION PHACO AND INTRAOCULAR LENS PLACEMENT (IOC);  Surgeon: Leandrew Koyanagi, MD;  Location: [place];  Service: Ophthalmology;  Laterality: Right;  . CHOLECYSTECTOMY    . COLONOSCOPY    . FOOT SURGERY     for MRSA  .  NEPHRECTOMY Right 2007  . SKIN CANCER EXCISION    . VEIN LIGATION Right     Prior to Admission medications   Medication Sig Start Date End Date Taking? Authorizing Provider  amLODipine (NORVASC) 10 MG tablet Take 10 mg by mouth daily. AM   Yes Historical Provider, MD  ergocalciferol (VITAMIN D2) 50000 UNITS capsule Take 50,000 Units by mouth once a week.   Yes Historical Provider, MD  hydrALAZINE (APRESOLINE) 25 MG tablet Take 25 mg by mouth every 8 (eight) hours.  09/18/16  Yes Historical Provider, MD  hydrochlorothiazide (HYDRODIURIL) 12.5 MG tablet Take 12.5 mg by mouth daily. AM   Yes Historical Provider, MD  omeprazole (PRILOSEC) 20 MG capsule Take 20 mg by mouth daily. AM   Yes Historical Provider, MD  rosuvastatin (CRESTOR) 10 MG tablet Take 10 mg by mouth daily. AM   Yes Historical Provider, MD  loratadine (CLARITIN) 10 MG tablet Take 10 mg by mouth daily.    Historical Provider, MD     Allergies Ciprofloxacin; Dilaudid [hydromorphone]; Macrobid [nitrofurantoin]; Sulfa antibiotics; and Levaquin [levofloxacin]  Family History  Problem Relation Age of Onset  . Breast cancer Neg Hx     Social History Social History  Substance Use Topics  . Smoking status: Never Smoker  . Smokeless tobacco: Not on file  . Alcohol use No    Review of Systems  Constitutional: No fever/chills   Cardiovascular: Denies chest pain. Respiratory: Denies shortness of breath. Gastrointestinal: No abdominal pain.  No  nausea, no vomiting.   Genitourinary: Negative for dysuria. Musculoskeletal: Negative for back pain. Skin: Negative for rash. Neurological: Negative for headaches   10-point ROS otherwise negative.  ____________________________________________   PHYSICAL EXAM:  VITAL SIGNS: ED Triage Vitals  Enc Vitals Group     BP 09/21/16 0953 (!) 156/104     Pulse Rate 09/21/16 0953 92     Resp 09/21/16 0953 18     Temp 09/21/16 0953 98 F (36.7 C)     Temp Source 09/21/16 0953 Oral       SpO2 09/21/16 0953 100 %     Weight 09/21/16 0956 163 lb (73.9 kg)     Height 09/21/16 0956 5\' 6"  (1.676 m)     Head Circumference --      Peak Flow --      Pain Score 09/21/16 0955 3     Pain Loc --      Pain Edu? --      Excl. in [place]? --     Constitutional: Alert and oriented. No acute distress. Pleasant and interactive Eyes: Conjunctivae are normal.   Nose: No congestion/rhinnorhea. Mouth/Throat: Mucous membranes are moist.    Cardiovascular: Normal rate, regular rhythm. Grossly normal heart sounds.  Good peripheral circulation. PVCs noted on exam Respiratory: Normal respiratory effort.  No retractions. Lungs CTAB. Gastrointestinal: Soft and nontender. No distention.  No CVA tenderness. Genitourinary: deferred Musculoskeletal: No lower extremity tenderness nor edema.  Warm and well perfused Neurologic:  Normal speech and language. No gross focal neurologic deficits are appreciated.  Skin:  Skin is warm, dry and intact. No rash noted. Psychiatric: Mood and affect are normal. Speech and behavior are normal.  ____________________________________________   LABS (all labs ordered are listed, but only abnormal results are displayed)  Labs Reviewed  CBC - Abnormal; Notable for the following:       Result Value   RBC 5.56 (*)    RDW 15.1 (*)    All other components within normal limits  COMPREHENSIVE METABOLIC PANEL - Abnormal; Notable for the following:    Sodium 132 (*)    Chloride 97 (*)    Glucose, Bld 105 (*)    All other components within normal limits  TROPONIN I  BRAIN NATRIURETIC PEPTIDE  MAGNESIUM  TSH  CBC  CREATININE, SERUM  TROPONIN I  TROPONIN I  TROPONIN I   ____________________________________________  EKG  ED ECG REPORT I, Lavonia Drafts, the attending physician, personally viewed and interpreted this ECG.  Date: 09/21/2016 Rate: 95 Rhythm: normal sinus rhythm QRS Axis: normal Intervals: normal ST/T Wave abnormalities: Nonspecific  changes Conduction Disturbances: PVC   ____________________________________________  RADIOLOGY  Chest x-ray demonstrated cardiomegaly ____________________________________________   PROCEDURES  Procedure(s) performed: No    Critical Care performed: No ____________________________________________   INITIAL IMPRESSION / ASSESSMENT AND PLAN / ED COURSE  Pertinent labs & imaging results that were available during my care of the patient were reviewed by me and considered in my medical decision making (see chart for details).  Patient overall well-appearing, mildly flushed. Complains of palpitations, left arm tingling and left jaw tingling. Concern for atypical presentation of ACS. Given her palpitations and dizziness labs EKG chest x-ray sent. These were reassuring. Her symptoms may be related to recent starting hydralazine as well. She will require admission for monitoring and further management.    ____________________________________________   FINAL CLINICAL IMPRESSION(S) / ED DIAGNOSES  Final diagnoses:  Atypical chest pain  PVC's (premature ventricular contractions)  NEW MEDICATIONS STARTED DURING THIS VISIT:  Current Discharge Medication List       Note:  This document was prepared using Dragon voice recognition software and may include unintentional dictation errors.    Lavonia Drafts, MD 09/21/16 1340

## 2016-09-21 NOTE — H&P (Signed)
[facility] NAME: Carolyn Frye    MR#:  [id number]  DATE OF BIRTH:  20-May-1941  DATE OF ADMISSION:  09/21/2016  PRIMARY CARE PHYSICIAN: Dion Body, MD   REQUESTING/REFERRING PHYSICIAN: dr Corky Downs  CHIEF COMPLAINT:  Palpitations   HISTORY OF PRESENT ILLNESS:  Carolyn Frye  is a 76 y.o. female with a known history of Essential hypertension and one kidney due to cancer since with above complaint. Over the past 2-1/2 weeks patient has had elevated blood pressure. She has been working with her primary care physician for better blood pressure control. She does state that over the past several weeks she has been under increased anxiety and stress. Over the past month she has had on and off episodes of palpitations. This morning she developed left jaw discomfort which radiated to her left arm with feelings of numbness and tingling. She denies any chest pain or shortness of breath associated with this, but has had dizziness. It lasts only seconds. No relieving or aggravating factors. In the emergency room she is noted to have some PVCs on telemetry monitoring. Her first set of troponins are negative.  PAST MEDICAL HISTORY:   Past Medical History:  Diagnosis Date  . Arthritis    hands, feet  . Cancer [facility]) 2007   kidney  . Cancer ([place]) 2014   melanoma  . Diverticulosis   . GERD (gastroesophageal reflux disease)   . Hypercholesteremia   . Hypertension   . MRSA (methicillin resistant Staphylococcus aureus) 03/03/2009   foot wound - treated  . Osteoporosis   . Seasonal allergies   . Wears contact lenses   . Wears dentures    partial bottom    PAST SURGICAL HISTORY:   Past Surgical History:  Procedure Laterality Date  . ABDOMINAL HYSTERECTOMY    . BREAST BIOPSY Left 1985   neg  . BREAST CYST ASPIRATION Left 1993   lt fna-neg  . CATARACT EXTRACTION W/PHACO Left 02/03/2015   Procedure: CATARACT EXTRACTION PHACO AND  INTRAOCULAR LENS PLACEMENT (IOC);  Surgeon: Leandrew Koyanagi, MD;  Location: [place];  Service: Ophthalmology;  Laterality: Left;  . CATARACT EXTRACTION W/PHACO Right 03/10/2015   Procedure: CATARACT EXTRACTION PHACO AND INTRAOCULAR LENS PLACEMENT (IOC);  Surgeon: Leandrew Koyanagi, MD;  Location: [place];  Service: Ophthalmology;  Laterality: Right;  . CHOLECYSTECTOMY    . COLONOSCOPY    . FOOT SURGERY     for MRSA  . NEPHRECTOMY Right 2007  . SKIN CANCER EXCISION    . VEIN LIGATION Right     SOCIAL HISTORY:   Social History  Substance Use Topics  . Smoking status: Never Smoker  . Smokeless tobacco: Not on file  . Alcohol use No    FAMILY HISTORY:   Family History  Problem Relation Age of Onset  . Breast cancer Neg Hx     DRUG ALLERGIES:   Allergies  Allergen Reactions  . Ciprofloxacin Other (See Comments)    Shoulder pain, tendon tear  . Dilaudid [Hydromorphone] Other (See Comments)    Severe headache  . Macrobid [Nitrofurantoin] Other (See Comments)    Red, burning eyes  . Sulfa Antibiotics Other (See Comments)  . Levaquin [Levofloxacin] Anxiety    REVIEW OF SYSTEMS:   Review of Systems  Constitutional: Negative.  Negative for chills, fever and malaise/fatigue.  HENT: Negative.  Negative for ear discharge, ear pain, hearing loss, nosebleeds and sore throat.   Eyes: Negative.  Negative  for blurred vision and pain.  Respiratory: Negative.  Negative for cough, hemoptysis, shortness of breath and wheezing.   Cardiovascular: Positive for palpitations. Negative for chest pain and leg swelling.  Gastrointestinal: Negative.  Negative for abdominal pain, blood in stool, diarrhea, nausea and vomiting.  Genitourinary: Negative.  Negative for dysuria.  Musculoskeletal: Negative.  Negative for back pain.  Skin: Negative.   Neurological: Positive for dizziness. Negative for tremors, speech change, focal weakness, seizures and headaches.   Endo/Heme/Allergies: Negative.  Does not bruise/bleed easily.  Psychiatric/Behavioral: Negative.  Negative for depression, hallucinations and suicidal ideas.    MEDICATIONS AT HOME:   Prior to Admission medications   Medication Sig Start Date End Date Taking? Authorizing Provider  amLODipine (NORVASC) 10 MG tablet Take 10 mg by mouth daily. AM   Yes Historical Provider, MD  ergocalciferol (VITAMIN D2) 50000 UNITS capsule Take 50,000 Units by mouth once a week.   Yes Historical Provider, MD  hydrALAZINE (APRESOLINE) 25 MG tablet Take 25 mg by mouth every 8 (eight) hours.  09/18/16  Yes Historical Provider, MD  hydrochlorothiazide (HYDRODIURIL) 12.5 MG tablet Take 12.5 mg by mouth daily. AM   Yes Historical Provider, MD  omeprazole (PRILOSEC) 20 MG capsule Take 20 mg by mouth daily. AM   Yes Historical Provider, MD  rosuvastatin (CRESTOR) 10 MG tablet Take 10 mg by mouth daily. AM   Yes Historical Provider, MD  loratadine (CLARITIN) 10 MG tablet Take 10 mg by mouth daily.    Historical Provider, MD      VITAL SIGNS:  Blood pressure 137/76, pulse 71, temperature 98 F (36.7 C), temperature source Oral, resp. rate 14, height 5\' 6"  (1.676 m), weight 73.9 kg (163 lb), SpO2 99 %.  PHYSICAL EXAMINATION:   Physical Exam  Constitutional: She is oriented to person, place, and time and well-developed, well-nourished, and in no distress. No distress.  HENT:  Head: Normocephalic.  Eyes: No scleral icterus.  Neck: Normal range of motion. Neck supple. No JVD present. No tracheal deviation present.  Cardiovascular: Normal rate, regular rhythm and normal heart sounds.  Exam reveals no gallop and no friction rub.   No murmur heard. Pulmonary/Chest: Effort normal and breath sounds normal. No respiratory distress. She has no wheezes. She has no rales. She exhibits no tenderness.  Abdominal: Soft. Bowel sounds are normal. She exhibits no distension and no mass. There is no tenderness. There is no rebound  and no guarding.  Musculoskeletal: Normal range of motion. She exhibits no edema.  Neurological: She is alert and oriented to person, place, and time.  Skin: Skin is warm. No rash noted. No erythema.  Psychiatric: Affect and judgment normal.      LABORATORY PANEL:   CBC  Recent Labs Lab 09/21/16 0959  WBC 6.5  HGB 15.8  HCT 46.6  PLT 239   ------------------------------------------------------------------------------------------------------------------  Chemistries   Recent Labs Lab 09/21/16 0959  NA 132*  K 3.7  CL 97*  CO2 25  GLUCOSE 105*  BUN 16  CREATININE 0.82  CALCIUM 9.6  MG 1.8  AST 23  ALT 15  ALKPHOS 95  BILITOT 0.7   ------------------------------------------------------------------------------------------------------------------  Cardiac Enzymes  Recent Labs Lab 09/21/16 0959  TROPONINI <0.03   ------------------------------------------------------------------------------------------------------------------  RADIOLOGY:  Dg Chest Portable 1 View  Result Date: 09/21/2016 CLINICAL DATA:  Patient with chest pain. EXAM: PORTABLE CHEST 1 VIEW COMPARISON:  Chest CT 05/24/2010 FINDINGS: Monitoring leads overlie the patient. Stable cardiomegaly. No consolidative pulmonary opacities. No pleural effusion or  pneumothorax. IMPRESSION: Cardiomegaly.  No acute cardiopulmonary process. Electronically Signed   By: Lovey Newcomer M.D.   On: 09/21/2016 10:50    EKG:   Sinus rhythm with PVCs and T-wave abnormalities in the anterior leads  IMPRESSION AND PLAN:   76 year old female with a history of essential hypertension which has been uncontrolled over the past 2-1/2 weeks who presents with palpitations.  1. Palpitations with jaw pain and left arm numbness concerning for angina Follow troponins Continue telemetry monitoring [facility] cardiology consult Echocardiogram and possible stress test in a.m. Check TSH  2. Essential hypertension: Continue HCTZ,  hydralazine and Norvasc  3. History of hyperlipidemia: Continue statin  4. GERD: Continue PPI  5. History of renal cancer status post nephrectomy currently in remission  All the records are reviewed and case discussed with ED provider. Management plans discussed with the patient and she is in agreement  CODE STATUS: FULL  TOTAL TIME TAKING CARE OF THIS PATIENT: 45 minutes.    Lanique Gonzalo M.D on 09/21/2016 at 12:39 PM  Between 7am to 6pm - Pager - [phone]  After 6pm go to [email or web address] - password [email or web address]  Office  [phone]  CC: Primary care physician; Dion Body, MD

## 2016-09-21 NOTE — Care Management (Signed)
Admitted from home with chest pain.  Troponins negative thus far and cardiology consult pending.  Independent in all adls, denies issues accessing medical care, obtaining medications or with transportation.  Current with her PCP.  No discharge needs identified at present by care manager or members of care team

## 2016-09-21 NOTE — Progress Notes (Signed)
*  PRELIMINARY RESULTS* Echocardiogram 2D Echocardiogram has been performed.  Carolyn Frye 09/21/2016, 5:33 PM

## 2016-09-21 NOTE — ED Triage Notes (Signed)
Pt states she has been working with her PCP for the past 3 weeks adjusting meds for HTN, states she started hydralizine on Monday and states feeling palpitations, flushed head pressure and jaw pain and left arm pain, and nausea, at present pt awake and alert, pt tearful, husband at bedside

## 2016-09-21 NOTE — ED Notes (Signed)
EDP at bedside  

## 2016-09-22 ENCOUNTER — Other Ambulatory Visit: Payer: Medicare Other

## 2016-09-22 DIAGNOSIS — R0789 Other chest pain: Secondary | ICD-10-CM | POA: Diagnosis not present

## 2016-09-22 DIAGNOSIS — I493 Ventricular premature depolarization: Secondary | ICD-10-CM | POA: Diagnosis not present

## 2016-09-22 LAB — BASIC METABOLIC PANEL
ANION GAP: 6 (ref 5–15)
BUN: 17 mg/dL (ref 6–20)
CO2: 25 mmol/L (ref 22–32)
Calcium: 8.7 mg/dL — ABNORMAL LOW (ref 8.9–10.3)
Chloride: 106 mmol/L (ref 101–111)
Creatinine, Ser: 0.67 mg/dL (ref 0.44–1.00)
GFR calc Af Amer: >60 mL/min (ref >60)
GFR calc non Af Amer: >60 mL/min (ref >60)
GLUCOSE: 97 mg/dL (ref 65–99)
Potassium: 3.2 mmol/L — ABNORMAL LOW (ref 3.5–5.1)
Sodium: 137 mmol/L (ref 135–145)

## 2016-09-22 LAB — CBC
HCT: 39.7 % (ref 35.0–47.0)
HEMOGLOBIN: 13.6 g/dL (ref 12.0–16.0)
MCH: 28.6 pg (ref 26.0–34.0)
MCHC: 34.2 g/dL (ref 32.0–36.0)
MCV: 83.5 fL (ref 80.0–100.0)
Platelets: 207 10*3/uL (ref 150–440)
RBC: 4.76 MIL/uL (ref 3.80–5.20)
RDW: 15.2 % — ABNORMAL HIGH (ref 11.5–14.5)
WBC: 5.7 10*3/uL (ref 3.6–11.0)

## 2016-09-22 MED ORDER — POTASSIUM CHLORIDE CRYS ER 20 MEQ PO TBCR
40.0000 meq | EXTENDED_RELEASE_TABLET | Freq: Once | ORAL | Status: AC
  Administered 2016-09-22: 40 meq via ORAL
  Filled 2016-09-22: qty 2

## 2016-09-22 MED ORDER — LORATADINE 10 MG PO TABS
10.0000 mg | ORAL_TABLET | Freq: Every day | ORAL | Status: DC
  Administered 2016-09-22: 10 mg via ORAL
  Filled 2016-09-22: qty 1

## 2016-09-22 NOTE — Progress Notes (Signed)
[facility] Encounter Note  Patient: Carolyn Frye / Admit Date: 09/21/2016 / Date of Encounter: 09/22/2016, 7:32 AM   Subjective: No further chest discomfort or other palpitations weakness or fatigue. Patient rested well overnight. Troponin normal. Echocardiogram with normal LV systolic function with ejection fraction of 60% Review of Systems: Positive for: None Negative for: Vision change, hearing change, syncope, dizziness, nausea, vomiting,diarrhea, bloody stool, stomach pain, cough, congestion, diaphoresis, urinary frequency, urinary pain,skin lesions, skin rashes Others previously listed  Objective: Telemetry: Normal sinus rhythm Physical Exam: Blood pressure (!) 123/59, pulse 76, temperature 98 F (36.7 C), temperature source Oral, resp. rate 16, height 5\' 6"  (1.676 m), weight 74.6 kg (164 lb 6.4 oz), SpO2 96 %. Body mass index is 26.53 kg/m. General: Well developed, well nourished, in no acute distress. Head: Normocephalic, atraumatic, sclera non-icteric, no xanthomas, nares are without discharge. Neck: No apparent masses Lungs: Normal respirations with no wheezes, no rhonchi, no rales , no crackles   Heart: Regular rate and rhythm, normal S1 S2, no murmur, no rub, no gallop, PMI is normal size and placement, carotid upstroke normal without bruit, jugular venous pressure normal Abdomen: Soft, non-tender, non-distended with normoactive bowel sounds. No hepatosplenomegaly. Abdominal aorta is normal size without bruit Extremities: No edema, no clubbing, no cyanosis, no ulcers,  Peripheral: 2+ radial, 2+ femoral, 2+ dorsal pedal pulses Neuro: Alert and oriented. Moves all extremities spontaneously. Psych:  Responds to questions appropriately with a normal affect.   Intake/Output Summary (Last 24 hours) at 09/22/16 0732 Last data filed at 09/22/16 0640  Gross per 24 hour  Intake              600 ml  Output              550 ml  Net               50 ml     Inpatient Medications:  . amLODipine  10 mg Oral Daily  . enoxaparin (LOVENOX) injection  40 mg Subcutaneous Q24H  . hydrALAZINE  25 mg Oral Q8H  . hydrochlorothiazide  12.5 mg Oral Daily  . loratadine  10 mg Oral Daily  . pantoprazole  40 mg Oral Daily  . rosuvastatin  10 mg Oral Daily  . sodium chloride flush  3 mL Intravenous Q12H  . [START ON 09/25/2016] Vitamin D (Ergocalciferol)  50,000 Units Oral Q Mon   Infusions:  . sodium chloride 75 mL/hr at 09/22/16 0507    Labs:  Recent Labs  09/21/16 0959 09/22/16 0317  NA 132* 137  K 3.7 3.2*  CL 97* 106  CO2 25 25  GLUCOSE 105* 97  BUN 16 17  CREATININE 0.82 0.67  CALCIUM 9.6 8.7*  MG 1.8  --     Recent Labs  09/21/16 0959  AST 23  ALT 15  ALKPHOS 95  BILITOT 0.7  PROT 7.4  ALBUMIN 4.3    Recent Labs  09/21/16 0959 09/22/16 0317  WBC 6.5 5.7  HGB 15.8 13.6  HCT 46.6 39.7  MCV 83.8 83.5  PLT 239 207    Recent Labs  09/21/16 0959 09/21/16 1409 09/21/16 1858  TROPONINI <0.03 <0.03 <0.03   Invalid input(s): POCBNP No results for input(s): HGBA1C in the last 72 hours.   Weights: Filed Weights   09/21/16 0956 09/21/16 1340  Weight: 73.9 kg (163 lb) 74.6 kg (164 lb 6.4 oz)     Radiology/Studies:  Dg Chest Portable 1 View  Result  Date: 09/21/2016 CLINICAL DATA:  Patient with chest pain. EXAM: PORTABLE CHEST 1 VIEW COMPARISON:  Chest CT 05/24/2010 FINDINGS: Monitoring leads overlie the patient. Stable cardiomegaly. No consolidative pulmonary opacities. No pleural effusion or pneumothorax. IMPRESSION: Cardiomegaly.  No acute cardiopulmonary process. Electronically Signed   By: Lovey Newcomer M.D.   On: 09/21/2016 10:50     Assessment and Recommendation  76 y.o. female with known essential hypertension mixed hyperlipidemia and family history of cardiovascular disease with atypical chest discomfort shortness of breath and other symptoms of unknown etiology without evidence of myocardial infarction 1.  Continue current medical regimen for hypertension control which appears to be working fairly well 2. No further cardiac intervention of atypical chest discomfort due to resolution and no evidence of myocardial infarction 3. Ambulation and further assessment of symptoms with ambulation and possible discharge to home with follow-up with cardiology for treatment of hypertension and cardiovascular risk factors  Signed, Serafina Royals M.D. FACC

## 2016-09-22 NOTE — Discharge Summary (Signed)
[facility] [place] NAME: Carolyn Frye    MR#:  [id number]  DATE OF BIRTH:  Dec 04, 1940  DATE OF ADMISSION:  09/21/2016 ADMITTING PHYSICIAN: Bettey Costa, MD  DATE OF DISCHARGE: 09/22/2016 12:06 PM  PRIMARY CARE PHYSICIAN: Dion Body, MD    ADMISSION DIAGNOSIS:  PVC's (premature ventricular contractions) [I49.3] Atypical chest pain [R07.89] Chest pain [R07.9]  DISCHARGE DIAGNOSIS:  Active Problems:   Chest pain   SECONDARY DIAGNOSIS:   Past Medical History:  Diagnosis Date  . Arthritis    hands, feet  . Cancer [facility]) 2007   kidney  . Cancer ([place]) 2014   melanoma  . Diverticulosis   . GERD (gastroesophageal reflux disease)   . Hypercholesteremia   . Hypertension   . MRSA (methicillin resistant Staphylococcus aureus) 03/03/2009   foot wound - treated  . Osteoporosis   . Seasonal allergies   . Wears contact lenses   . Wears dentures    partial bottom    HOSPITAL COURSE:   1. Accelerated hypertension. Patient kept on her usual hydrochlorothiazide, amlodipine and her recently started hydralazine. I believe stress does play a factor in this elevation of blood pressure. Patient's blood pressure upon discharge normal range. 2. Palpitations. TSH normal range. Echocardiogram normal EF. On heart monitor did see some PVCs. Seen by cardiology and no further workup. Follow-up as outpatient 3. Hyperlipidemia unspecified on Crestor 4. History of kidney cancer 5. History of melanoma 6. Anxiety. Patient does not want any medications for this. I believe that a lot of her issues with blood pressure and palpitations could be secondary to stress. She worries about her only daughter and her situation. 7. GERD on omeprazole  Of note the patient does have some flushing in the face at times. Can consider 24-hour urine for metanephrines and 5 HIAA as outpatient.   DISCHARGE CONDITIONS:   Satisfactory  CONSULTS OBTAINED:  Treatment  Team:  Corey Skains, MD  DRUG ALLERGIES:   Allergies  Allergen Reactions  . Ciprofloxacin Other (See Comments)    Shoulder pain, tendon tear  . Dilaudid [Hydromorphone] Other (See Comments)    Severe headache  . Macrobid [Nitrofurantoin] Other (See Comments)    Red, burning eyes  . Sulfa Antibiotics Other (See Comments)  . Levaquin [Levofloxacin] Anxiety    DISCHARGE MEDICATIONS:   Discharge Medication List as of 09/22/2016 11:33 AM    CONTINUE these medications which have NOT CHANGED   Details  amLODipine (NORVASC) 10 MG tablet Take 10 mg by mouth daily. AM, Historical Med    ergocalciferol (VITAMIN D2) 50000 UNITS capsule Take 50,000 Units by mouth once a week., Historical Med    hydrALAZINE (APRESOLINE) 25 MG tablet Take 25 mg by mouth every 8 (eight) hours. , Starting Mon 09/18/2016, Historical Med    hydrochlorothiazide (HYDRODIURIL) 12.5 MG tablet Take 12.5 mg by mouth daily. AM, Historical Med    omeprazole (PRILOSEC) 20 MG capsule Take 20 mg by mouth daily. AM, Historical Med    rosuvastatin (CRESTOR) 10 MG tablet Take 10 mg by mouth daily. AM, Historical Med    loratadine (CLARITIN) 10 MG tablet Take 10 mg by mouth daily., Until Discontinued, Historical Med         DISCHARGE INSTRUCTIONS:   Follow-up with PMD one week Follow-up with Dr. Nehemiah Massed cardiology 1 week  If you experience worsening of your admission symptoms, develop shortness of breath, life threatening emergency, suicidal or homicidal thoughts you must seek medical attention  immediately by calling 911 or calling your MD immediately  if symptoms less severe.  You Must read complete instructions/literature along with all the possible adverse reactions/side effects for all the Medicines you take and that have been prescribed to you. Take any new Medicines after you have completely understood and accept all the possible adverse reactions/side effects.   Please note  You were cared for by a  hospitalist during your hospital stay. If you have any questions about your discharge medications or the care you received while you were in the hospital after you are discharged, you can call the unit and asked to speak with the hospitalist on call if the hospitalist that took care of you is not available. Once you are discharged, your primary care physician will handle any further medical issues. Please note that NO REFILLS for any discharge medications will be authorized once you are discharged, as it is imperative that you return to your primary care physician (or establish a relationship with a primary care physician if you do not have one) for your aftercare needs so that they can reassess your need for medications and monitor your lab values.    Today   CHIEF COMPLAINT:   Chief Complaint  Patient presents with  . Jaw Pain  . Chest Pain    HISTORY OF PRESENT ILLNESS:  Carolyn Frye  is a 76 y.o. female with a known history of Hypertension presented with palpitations and some jaw pain   VITAL SIGNS:  Blood pressure (!) 123/59, pulse 76, temperature 98 F (36.7 C), temperature source Oral, resp. rate 16, height 5\' 6"  (1.676 m), weight 74.6 kg (164 lb 6.4 oz), SpO2 96 %.   PHYSICAL EXAMINATION:  GENERAL:  76 y.o.-year-old patient lying in the bed with no acute distress.  EYES: Pupils equal, round, reactive to light and accommodation. No scleral icterus. Extraocular muscles intact.  HEENT: Head atraumatic, normocephalic. Oropharynx and nasopharynx clear.  NECK:  Supple, no jugular venous distention. No thyroid enlargement, no tenderness.  LUNGS: Normal breath sounds bilaterally, no wheezing, rales,rhonchi or crepitation. No use of accessory muscles of respiration.  CARDIOVASCULAR: S1, S2 normal. No murmurs, rubs, or gallops.  ABDOMEN: Soft, non-tender, non-distended. Bowel sounds present. No organomegaly or mass.  EXTREMITIES: No pedal edema, cyanosis, or clubbing.  NEUROLOGIC:  Cranial nerves II through XII are intact. Muscle strength 5/5 in all extremities. Sensation intact. Gait not checked.  PSYCHIATRIC: The patient is alert and oriented x 3.  SKIN: No obvious rash, lesion, or ulcer.   DATA REVIEW:   CBC  Recent Labs Lab 09/22/16 0317  WBC 5.7  HGB 13.6  HCT 39.7  PLT 207    Chemistries   Recent Labs Lab 09/21/16 0959 09/22/16 0317  NA 132* 137  K 3.7 3.2*  CL 97* 106  CO2 25 25  GLUCOSE 105* 97  BUN 16 17  CREATININE 0.82 0.67  CALCIUM 9.6 8.7*  MG 1.8  --   AST 23  --   ALT 15  --   ALKPHOS 95  --   BILITOT 0.7  --     Cardiac Enzymes  Recent Labs Lab 09/21/16 1858  TROPONINI <0.03     RADIOLOGY:  Dg Chest Portable 1 View  Result Date: 09/21/2016 CLINICAL DATA:  Patient with chest pain. EXAM: PORTABLE CHEST 1 VIEW COMPARISON:  Chest CT 05/24/2010 FINDINGS: Monitoring leads overlie the patient. Stable cardiomegaly. No consolidative pulmonary opacities. No pleural effusion or pneumothorax. IMPRESSION: Cardiomegaly.  No acute  cardiopulmonary process. Electronically Signed   By: Lovey Newcomer M.D.   On: 09/21/2016 10:50       Management plans discussed with the patient, and she is in agreement.  CODE STATUS:  Code Status History    Date Active Date Inactive Code Status Order ID Comments User Context   09/21/2016  1:10 PM 09/22/2016  3:11 PM Full Code [id number]  Bettey Costa, MD ED      TOTAL TIME TAKING CARE OF THIS PATIENT: 35 minutes.    Loletha Grayer M.D on 09/22/2016 at 6:03 PM  Between 7am to 6pm - Pager - [phone]  After 6pm go to [email or web address] - password [email or web address] [facility]  [phone]  CC: Primary care physician; Dion Body, MD

## 2016-09-22 NOTE — Discharge Instructions (Signed)
Palpitations A palpitation is the feeling that your heart:  Has an uneven (irregular) heartbeat.  Is beating faster than normal.  Is fluttering.  Is skipping a beat. This is usually not a serious problem. In some cases, you may need more medical tests. Follow these instructions at home:  Avoid:  Caffeine in coffee, tea, soft drinks, diet pills, and energy drinks.  Chocolate.  Alcohol.  Do not use any tobacco products. These include cigarettes, chewing tobacco, and e-cigarettes. If you need help quitting, ask your doctor.  Try to reduce your stress. These things may help:  Yoga.  Meditation.  Physical activity. Swimming, jogging, and walking are good choices.  A method that helps you use your mind to control things in your body, like heartbeats (biofeedback).  Get plenty of rest and sleep.  Take over-the-counter and prescription medicines only as told by your doctor.  Keep all follow-up visits as told by your doctor. This is important. Contact a doctor if:  Your heartbeat is still fast or uneven after 24 hours.  Your palpitations occur more often. Get help right away if:  You have chest pain.  You feel short of breath.  You have a very bad headache.  You feel dizzy.  You pass out (faint). This information is not intended to replace advice given to you by your health care provider. Make sure you discuss any questions you have with your health care provider. Document Released: 03/14/2008 Document Revised: 11/11/2015 Document Reviewed: 02/18/2015 Elsevier Interactive Patient Education  2017 [employer].

## 2017-01-15 ENCOUNTER — Ambulatory Visit
Admission: RE | Admit: 2017-01-15 | Discharge: 2017-01-15 | Disposition: A | Discharge status: Home / Self Care | Payer: Medicare Other | Source: Ambulatory Visit | Attending: Family Medicine | Admitting: Family Medicine

## 2017-01-15 DIAGNOSIS — Z452 Encounter for adjustment and management of vascular access device: Secondary | ICD-10-CM | POA: Diagnosis not present

## 2017-01-15 MED ORDER — VANCOMYCIN HCL 10 G IV SOLR
1250.0000 mg | Freq: Once | INTRAVENOUS | Status: AC
  Administered 2017-01-15: 1250 mg via INTRAVENOUS
  Filled 2017-01-15: qty 1250

## 2017-01-15 MED ORDER — SODIUM CHLORIDE FLUSH 0.9 % IV SOLN
INTRAVENOUS | Status: AC
  Administered 2017-01-15: 10 mL
  Filled 2017-01-15: qty 10

## 2017-01-15 MED ORDER — SODIUM CHLORIDE 0.9% FLUSH
10.0000 mL | Freq: Once | INTRAVENOUS | Status: AC
  Administered 2017-01-15: 10 mL

## 2017-01-15 NOTE — OR Nursing (Signed)
Vancomycin infused,  Flushed with NS via pump, and then with 10 ml NS.  D/c home stable, home health to infuse pt tomorrow at home.

## 2017-01-15 NOTE — OR Nursing (Signed)
Tech in to insert PICC line, patient tolerated well.  IV Vancomycin started via pump @ 1533.

## 2017-01-21 ENCOUNTER — Emergency Department: Payer: Medicare Other

## 2017-01-21 DIAGNOSIS — R42 Dizziness and giddiness: Secondary | ICD-10-CM | POA: Diagnosis present

## 2017-01-21 DIAGNOSIS — Z79899 Other long term (current) drug therapy: Secondary | ICD-10-CM | POA: Insufficient documentation

## 2017-01-21 DIAGNOSIS — I10 Essential (primary) hypertension: Secondary | ICD-10-CM | POA: Insufficient documentation

## 2017-01-21 LAB — CBC
HEMATOCRIT: 41.8 % (ref 35.0–47.0)
Hemoglobin: 14.2 g/dL (ref 12.0–16.0)
MCH: 28.8 pg (ref 26.0–34.0)
MCHC: 34 g/dL (ref 32.0–36.0)
MCV: 84.6 fL (ref 80.0–100.0)
Platelets: 202 10*3/uL (ref 150–440)
RBC: 4.93 MIL/uL (ref 3.80–5.20)
RDW: 14.5 % (ref 11.5–14.5)
WBC: 6.8 10*3/uL (ref 3.6–11.0)

## 2017-01-21 LAB — BASIC METABOLIC PANEL
Anion gap: 10 (ref 5–15)
BUN: 14 mg/dL (ref 6–20)
CHLORIDE: 101 mmol/L (ref 101–111)
CO2: 25 mmol/L (ref 22–32)
Calcium: 9.7 mg/dL (ref 8.9–10.3)
Creatinine, Ser: 0.73 mg/dL (ref 0.44–1.00)
GFR calc Af Amer: >60 mL/min (ref >60)
GFR calc non Af Amer: >60 mL/min (ref >60)
GLUCOSE: 100 mg/dL — AB (ref 65–99)
POTASSIUM: 3.8 mmol/L (ref 3.5–5.1)
SODIUM: 136 mmol/L (ref 135–145)

## 2017-01-21 LAB — VANCOMYCIN, TROUGH: Vancomycin Tr: 22 ug/mL (ref 15–20)

## 2017-01-21 LAB — TROPONIN I: Troponin I: <0.03 ng/mL (ref <0.03)

## 2017-01-21 NOTE — ED Triage Notes (Addendum)
Pt states that her bp was elevated tonight, feels flushed, dizziness. pt's face and chest are red, pt states that happens when she gets anxious, pt reports checking her bp and it was high, pt states that he stayed high after lying down, pt reports that she is currently taking vancomycin through a picc line and this is her 7th day for lesions that are not open on her bottom and face that are healed. Pt reports burning and pressure sensation to the left side of her chest and her shoulder

## 2017-01-22 ENCOUNTER — Emergency Department
Admission: EM | Admit: 2017-01-22 | Discharge: 2017-01-22 | Disposition: A | Discharge status: Home / Self Care | Payer: Medicare Other | Attending: Emergency Medicine | Admitting: Emergency Medicine

## 2017-01-22 ENCOUNTER — Emergency Department: Payer: Medicare Other

## 2017-01-22 ENCOUNTER — Encounter: Payer: Self-pay | Admitting: Radiology

## 2017-01-22 DIAGNOSIS — R42 Dizziness and giddiness: Secondary | ICD-10-CM | POA: Diagnosis not present

## 2017-01-22 DIAGNOSIS — I10 Essential (primary) hypertension: Secondary | ICD-10-CM

## 2017-01-22 LAB — TROPONIN I: Troponin I: <0.03 ng/mL (ref <0.03)

## 2017-01-22 MED ORDER — MECLIZINE HCL 25 MG PO TABS: 25.0000 mg | ORAL_TABLET | Freq: Once | ORAL | Status: DC

## 2017-01-22 MED ORDER — IOPAMIDOL (ISOVUE-370) INJECTION 76%
75.0000 mL | Freq: Once | INTRAVENOUS | Status: AC | PRN
  Administered 2017-01-22: 75 mL via INTRAVENOUS

## 2017-01-22 NOTE — ED Provider Notes (Signed)
[facility] Emergency Department Provider Note   ____________________________________________   First MD Initiated Contact with Patient 01/22/17 0145     (approximate)  I have reviewed the triage vital signs and the nursing notes.   HISTORY  Chief Complaint Hypertension   HPI Carolyn Frye is a 76 y.o. female who comes into the hospital today with some dizziness and high blood pressure. The patient states that she has been on vancomycin IV since Monday. She had some hard knots for which she had previously been treated with doxycycline. The patient was found to have MRSA and has been treated with vancomycin using a PICC line. The patient is on blood pressure medicine and reports that she had some lightheadedness on and off this week. Today she felt that the symptoms were more severe so she checked her blood pressure. The patient was laying down when she had this dizziness. The blood pressure was 201/107 and her pulse was 103. She became anxious and decided to come in and get checked out. The patient denies any difficulty breathing but did have some burning in her left chest/shoulder which concerned her. The patient recently had labetalol added to her blood pressure medication regimen. She was here April 5 and admitted with arm and jaw pain. The patient reports that she thinks that the burning was more due to indigestion but was concerned about the dizziness and the blood pressure. She had no weakness numbness or tingling. She is here for evaluation.   Past Medical History:  Diagnosis Date  . Arthritis    hands, feet  . Cancer [facility]) 2007   kidney  . Cancer ([place]) 2014   melanoma  . Diverticulosis   . GERD (gastroesophageal reflux disease)   . Hypercholesteremia   . Hypertension   . MRSA (methicillin resistant Staphylococcus aureus) 03/03/2009   foot wound - treated  . Osteoporosis   . Seasonal allergies   . Wears contact lenses   . Wears dentures      partial bottom    Patient Active Problem List   Diagnosis Date Noted  . Chest pain 09/21/2016    Past Surgical History:  Procedure Laterality Date  . ABDOMINAL HYSTERECTOMY    . BREAST BIOPSY Left 1985   neg  . BREAST CYST ASPIRATION Left 1993   lt fna-neg  . CATARACT EXTRACTION W/PHACO Left 02/03/2015   Procedure: CATARACT EXTRACTION PHACO AND INTRAOCULAR LENS PLACEMENT (IOC);  Surgeon: Leandrew Koyanagi, MD;  Location: [place];  Service: Ophthalmology;  Laterality: Left;  . CATARACT EXTRACTION W/PHACO Right 03/10/2015   Procedure: CATARACT EXTRACTION PHACO AND INTRAOCULAR LENS PLACEMENT (IOC);  Surgeon: Leandrew Koyanagi, MD;  Location: [place];  Service: Ophthalmology;  Laterality: Right;  . CHOLECYSTECTOMY    . COLONOSCOPY    . FOOT SURGERY     for MRSA  . NEPHRECTOMY Right 2007  . SKIN CANCER EXCISION    . VEIN LIGATION Right     Prior to Admission medications   Medication Sig Start Date End Date Taking? Authorizing Provider  amLODipine (NORVASC) 10 MG tablet Take 10 mg by mouth daily. AM    [provider]  ergocalciferol (VITAMIN D2) 50000 UNITS capsule Take 50,000 Units by mouth once a week.    [provider]  hydrALAZINE (APRESOLINE) 25 MG tablet Take 25 mg by mouth every 8 (eight) hours.  09/18/16   [provider]  hydrochlorothiazide (HYDRODIURIL) 12.5 MG tablet Take 12.5 mg by mouth daily. AM  [provider]  labetalol (NORMODYNE) 100 MG tablet Take 100 mg by mouth 2 (two) times daily.    [provider]  loratadine (CLARITIN) 10 MG tablet Take 10 mg by mouth daily.    [provider]  omeprazole (PRILOSEC) 20 MG capsule Take 20 mg by mouth daily. AM    [provider]  rosuvastatin (CRESTOR) 10 MG tablet Take 10 mg by mouth daily. AM    [provider]    Allergies Ciprofloxacin; Dilaudid [hydromorphone]; Macrobid [nitrofurantoin]; Sulfa antibiotics; and  Levaquin [levofloxacin]  Family History  Problem Relation Age of Onset  . Breast cancer Neg Hx     Social History Social History  Substance Use Topics  . Smoking status: Never Smoker  . Smokeless tobacco: Not on file  . Alcohol use No    Review of Systems  Constitutional: Hypertension Eyes: No visual changes. ENT: No sore throat. Cardiovascular:  chest pain. Respiratory: Denies shortness of breath. Gastrointestinal: No abdominal pain.  No nausea, no vomiting.  No diarrhea.  No constipation. Genitourinary: Negative for dysuria. Musculoskeletal: Negative for back pain. Skin: Negative for rash. Neurological: Dizziness   ____________________________________________   PHYSICAL EXAM:  VITAL SIGNS: ED Triage Vitals  Enc Vitals Group     BP 01/21/17 2221 (!) 161/86     Pulse Rate 01/21/17 2221 68     Resp 01/21/17 2221 18     Temp 01/21/17 2221 98.3 F (36.8 C)     Temp Source 01/21/17 2221 Oral     SpO2 01/21/17 2221 99 %     Weight 01/21/17 2218 158 lb (71.7 kg)     Height 01/21/17 2218 5\' 6"  (1.676 m)     Head Circumference --      Peak Flow --      Pain Score 01/21/17 2218 0     Pain Loc --      Pain Edu? --      Excl. in [place]? --     Constitutional: Alert and oriented. Well appearing and in no acute distress. Eyes: Conjunctivae are normal. PERRL. EOMI. Head: Atraumatic. Nose: No congestion/rhinnorhea. Mouth/Throat: Mucous membranes are moist.  Oropharynx non-erythematous. Cardiovascular: Normal rate, regular rhythm. Grossly normal heart sounds.  Good peripheral circulation. Respiratory: Normal respiratory effort.  No retractions. Lungs CTAB. Gastrointestinal: Soft and nontender. No distention. Positive bowel sounds Musculoskeletal: No lower extremity tenderness nor edema.   Neurologic:  Normal speech and language. Cranial nerves II through XII are grossly intact with no focal motor or neuro deficits Skin:  Skin is warm, dry and intact.  Psychiatric: Mood  and affect are normal.   ____________________________________________   LABS (all labs ordered are listed, but only abnormal results are displayed)  Labs Reviewed  BASIC METABOLIC PANEL - Abnormal; Notable for the following:       Result Value   Glucose, Bld 100 (*)    All other components within normal limits  VANCOMYCIN, TROUGH - Abnormal; Notable for the following:    Vancomycin Tr 22 (*)    All other components within normal limits  CBC  TROPONIN I  TROPONIN I   ____________________________________________  EKG  ED ECG REPORT I, Loney Hering, the attending physician, personally viewed and interpreted this ECG.   Date: 01/21/2017  EKG Time: 2221  Rate: 81  Rhythm: normal sinus rhythm  Axis: normal  Intervals:none  ST&T Change: Flipped T-wave in lead III  ____________________________________________  RADIOLOGY  Ct Angio Head W Or Wo Contrast  Result Date: 01/22/2017 CLINICAL DATA:  Initial evaluation for acute vertigo. EXAM: CT ANGIOGRAPHY HEAD AND NECK TECHNIQUE: Multidetector CT imaging of the head and neck was performed using the standard protocol during bolus administration of intravenous contrast. Multiplanar CT image reconstructions and MIPs were obtained to evaluate the vascular anatomy. Carotid stenosis measurements (when applicable) are obtained utilizing NASCET criteria, using the distal internal carotid diameter as the denominator. CONTRAST:  75 cc of Isovue 370. COMPARISON:  None. FINDINGS: CT HEAD FINDINGS Brain: Generalized age-related cerebral atrophy with mild chronic small vessel ischemic disease. Subcentimeter lucency at the left lentiform nucleus may reflect small remote lacunar infarct or possibly dilated perivascular space. No acute intracranial hemorrhage. No evidence for acute large vessel territory infarct. No mass lesion, midline shift or mass effect. No hydrocephalus. No extra-axial fluid collection. Vascular: No hyperdense vessel. Scattered  vascular calcifications noted within the carotid siphons. Skull: Scalp soft tissues and calvarium within normal limits. Sinuses: Paranasal sinuses and mastoids are clear. Orbits: Globes and orbital soft tissues within normal limits. Patient status post lens extraction bilaterally. Review of the MIP images confirms the above findings CTA NECK FINDINGS Aortic arch: Aortic arch within normal limits with normal branch pattern. Mild scattered plaque within the arch and about the origin the great vessels without flow-limiting stenosis. Visualized subclavian artery is widely patent. Right carotid system: Right common and internal carotid artery's widely patent without stenosis, dissection, or occlusion. No atheromatous narrowing about the right carotid bifurcation. Left carotid system: Left common carotid artery widely patent from its origin to the bifurcation. A centric calcified plaque at the proximal left ICA without flow-limiting stenosis. Left ICA otherwise widely patent to the skullbase without stenosis, dissection, or occlusion. Vertebral arteries: Both of the vertebral arteries arise from the subclavian arteries. Vertebral arteries patent within the neck without stenosis, dissection, or occlusion. Skeleton: No acute osseus abnormality. No worrisome lytic or blastic osseous lesions. Other neck: Soft tissues of the neck within normal limits. No adenopathy. Subcentimeter hypodense nodule noted within left thyroid lobe, of doubtful significance. Upper chest: Visualized upper chest within normal limits. Partially visualized lungs are clear. Review of the MIP images confirms the above findings CTA HEAD FINDINGS Anterior circulation: Petrous, cavernous, and supraclinoid segments widely patent without flow-limiting stenosis. Mild scattered atheromatous plaque noted within the cavernous ICAs bilaterally. ICA termini widely patent. A1 segments widely patent. Anterior communicating artery normal. Anterior cerebral arteries  widely patent to their distal aspects without stenosis. M1 segments patent without stenosis or occlusion. MCA bifurcations normal. No proximal M2 occlusion. Distal MCA branches well opacified and symmetric. Posterior circulation: Vertebral artery's patent to the vertebrobasilar junction without stenosis. Left vertebral artery dominant. Posterior inferior cerebral arteries patent bilaterally. Basilar artery widely patent. Superior cerebral arteries patent bilaterally. Fetal type origin of the PCAs bilaterally, supplied via widely patent posterior communicating arteries. Venous sinuses: Patent. Anatomic variants: Fetal type origin of the PCAs. No aneurysm or vascular malformation. Delayed phase: No pathologic enhancement. Review of the MIP images confirms the above findings IMPRESSION: 1. Negative CTA of the head and neck. No large vessel occlusion. No high-grade or correctable stenosis. Widely patent vertebrobasilar system. 2. Mild atheromatous disease for patient age involving the left carotid bifurcation and carotid siphons. Again, no hemodynamically significant stenosis. 3. No acute intracranial process. Electronically Signed   By: Jeannine Boga M.D.   On: 01/22/2017 04:31   Dg Chest 2 View  Result Date: 01/21/2017 CLINICAL DATA:  Chest pain and dizziness today. EXAM: CHEST  2  VIEW COMPARISON:  Chest radiograph September 21, 2016 FINDINGS: Cardiac silhouette is mildly enlarged and unchanged. Suspected coronary artery stent. No pleural effusion or focal consolidation. No pneumothorax. RIGHT PICC distal tip projects in mid superior vena cava. No pneumo thorax. Soft tissue planes and included osseous structures are nonsuspicious. Subcentimeter calcification projecting the breasts. IMPRESSION: Stable cardiomegaly.  No acute pulmonary process. RIGHT PICC distal tip projects in mid superior vena cava. Electronically Signed   By: Elon Alas M.D.   On: 01/21/2017 23:28   Ct Angio Neck W And/or Wo  Contrast  Result Date: 01/22/2017 CLINICAL DATA:  Initial evaluation for acute vertigo. EXAM: CT ANGIOGRAPHY HEAD AND NECK TECHNIQUE: Multidetector CT imaging of the head and neck was performed using the standard protocol during bolus administration of intravenous contrast. Multiplanar CT image reconstructions and MIPs were obtained to evaluate the vascular anatomy. Carotid stenosis measurements (when applicable) are obtained utilizing NASCET criteria, using the distal internal carotid diameter as the denominator. CONTRAST:  75 cc of Isovue 370. COMPARISON:  None. FINDINGS: CT HEAD FINDINGS Brain: Generalized age-related cerebral atrophy with mild chronic small vessel ischemic disease. Subcentimeter lucency at the left lentiform nucleus may reflect small remote lacunar infarct or possibly dilated perivascular space. No acute intracranial hemorrhage. No evidence for acute large vessel territory infarct. No mass lesion, midline shift or mass effect. No hydrocephalus. No extra-axial fluid collection. Vascular: No hyperdense vessel. Scattered vascular calcifications noted within the carotid siphons. Skull: Scalp soft tissues and calvarium within normal limits. Sinuses: Paranasal sinuses and mastoids are clear. Orbits: Globes and orbital soft tissues within normal limits. Patient status post lens extraction bilaterally. Review of the MIP images confirms the above findings CTA NECK FINDINGS Aortic arch: Aortic arch within normal limits with normal branch pattern. Mild scattered plaque within the arch and about the origin the great vessels without flow-limiting stenosis. Visualized subclavian artery is widely patent. Right carotid system: Right common and internal carotid artery's widely patent without stenosis, dissection, or occlusion. No atheromatous narrowing about the right carotid bifurcation. Left carotid system: Left common carotid artery widely patent from its origin to the bifurcation. A centric calcified plaque  at the proximal left ICA without flow-limiting stenosis. Left ICA otherwise widely patent to the skullbase without stenosis, dissection, or occlusion. Vertebral arteries: Both of the vertebral arteries arise from the subclavian arteries. Vertebral arteries patent within the neck without stenosis, dissection, or occlusion. Skeleton: No acute osseus abnormality. No worrisome lytic or blastic osseous lesions. Other neck: Soft tissues of the neck within normal limits. No adenopathy. Subcentimeter hypodense nodule noted within left thyroid lobe, of doubtful significance. Upper chest: Visualized upper chest within normal limits. Partially visualized lungs are clear. Review of the MIP images confirms the above findings CTA HEAD FINDINGS Anterior circulation: Petrous, cavernous, and supraclinoid segments widely patent without flow-limiting stenosis. Mild scattered atheromatous plaque noted within the cavernous ICAs bilaterally. ICA termini widely patent. A1 segments widely patent. Anterior communicating artery normal. Anterior cerebral arteries widely patent to their distal aspects without stenosis. M1 segments patent without stenosis or occlusion. MCA bifurcations normal. No proximal M2 occlusion. Distal MCA branches well opacified and symmetric. Posterior circulation: Vertebral artery's patent to the vertebrobasilar junction without stenosis. Left vertebral artery dominant. Posterior inferior cerebral arteries patent bilaterally. Basilar artery widely patent. Superior cerebral arteries patent bilaterally. Fetal type origin of the PCAs bilaterally, supplied via widely patent posterior communicating arteries. Venous sinuses: Patent. Anatomic variants: Fetal type origin of the PCAs. No aneurysm or vascular malformation.  Delayed phase: No pathologic enhancement. Review of the MIP images confirms the above findings IMPRESSION: 1. Negative CTA of the head and neck. No large vessel occlusion. No high-grade or correctable  stenosis. Widely patent vertebrobasilar system. 2. Mild atheromatous disease for patient age involving the left carotid bifurcation and carotid siphons. Again, no hemodynamically significant stenosis. 3. No acute intracranial process. Electronically Signed   By: Jeannine Boga M.D.   On: 01/22/2017 04:31    ____________________________________________   PROCEDURES  Procedure(s) performed: None  Procedures  Critical Care performed: No  ____________________________________________   INITIAL IMPRESSION / ASSESSMENT AND PLAN / ED COURSE  Pertinent labs & imaging results that were available during my care of the patient were reviewed by me and considered in my medical decision making (see chart for details).  This is a 76 year old female who comes into the hospital today with some dizziness and elevated blood pressure. She has been receiving vancomycin and she is unsure if that may be the cause of her dizziness. We did check some blood work to include 2 troponins that was negative. Since the patient did have his high blood pressure I did a CTA of her head and neck looking for areas of narrowing or stenosis. The patient CTA was unremarkable. I had ordered some meclizine for the patient but she reports that her dizziness had improved. By the time I saw the patient her blood pressure had improved on its own. They had gotten into the 139/79. I informed the patient that she should still follow-up with her primary care physician so he knows what's going on and can further treat her blood pressure as needed. The patient feels well at this time. She will be discharged home to follow-up with his primary care physician.      ____________________________________________   FINAL CLINICAL IMPRESSION(S) / ED DIAGNOSES  Final diagnoses:  Dizziness  Hypertension, unspecified type      NEW MEDICATIONS STARTED DURING THIS VISIT:  Discharge Medication List as of 01/22/2017  4:51 AM        Note:  This document was prepared using Dragon voice recognition software and may include unintentional dictation errors.    Loney Hering, MD 01/22/17 [phone]

## 2017-01-22 NOTE — ED Notes (Signed)
Patient transported to CT 

## 2017-01-22 NOTE — Discharge Instructions (Signed)
Please follow-up with your PCP

## 2017-02-07 ENCOUNTER — Emergency Department: Payer: Medicare Other

## 2017-02-07 ENCOUNTER — Emergency Department
Admission: EM | Admit: 2017-02-07 | Discharge: 2017-02-07 | Disposition: A | Discharge status: Home / Self Care | Payer: Medicare Other | Attending: Student in an Organized Health Care Education/Training Program | Admitting: Student in an Organized Health Care Education/Training Program

## 2017-02-07 ENCOUNTER — Encounter: Payer: Self-pay | Admitting: Intensive Care

## 2017-02-07 DIAGNOSIS — R42 Dizziness and giddiness: Secondary | ICD-10-CM | POA: Diagnosis not present

## 2017-02-07 DIAGNOSIS — Z79899 Other long term (current) drug therapy: Secondary | ICD-10-CM | POA: Diagnosis not present

## 2017-02-07 DIAGNOSIS — I10 Essential (primary) hypertension: Secondary | ICD-10-CM | POA: Diagnosis present

## 2017-02-07 LAB — CBC WITH DIFFERENTIAL/PLATELET
BASOS PCT: 1 %
Basophils Absolute: 0.1 10*3/uL (ref 0–0.1)
EOS ABS: 0.2 10*3/uL (ref 0–0.7)
EOS PCT: 3 %
HCT: 41.9 % (ref 35.0–47.0)
HEMOGLOBIN: 14.4 g/dL (ref 12.0–16.0)
Lymphocytes Relative: 21 %
Lymphs Abs: 1.3 10*3/uL (ref 1.0–3.6)
MCH: 28.7 pg (ref 26.0–34.0)
MCHC: 34.4 g/dL (ref 32.0–36.0)
MCV: 83.4 fL (ref 80.0–100.0)
MONOS PCT: 11 %
Monocytes Absolute: 0.7 10*3/uL (ref 0.2–0.9)
NEUTROS PCT: 64 %
Neutro Abs: 4.2 10*3/uL (ref 1.4–6.5)
PLATELETS: 244 10*3/uL (ref 150–440)
RBC: 5.02 MIL/uL (ref 3.80–5.20)
RDW: 14.9 % — ABNORMAL HIGH (ref 11.5–14.5)
WBC: 6.5 10*3/uL (ref 3.6–11.0)

## 2017-02-07 LAB — URINALYSIS, COMPLETE (UACMP) WITH MICROSCOPIC
BACTERIA UA: NONE SEEN
BILIRUBIN URINE: NEGATIVE
Glucose, UA: NEGATIVE mg/dL
KETONES UR: NEGATIVE mg/dL
LEUKOCYTES UA: NEGATIVE
NITRITE: NEGATIVE
PROTEIN: NEGATIVE mg/dL
SPECIFIC GRAVITY, URINE: 1.002 — AB (ref 1.005–1.030)
Squamous Epithelial / LPF: NONE SEEN
WBC UA: NONE SEEN WBC/hpf (ref 0–5)
pH: 8 (ref 5.0–8.0)

## 2017-02-07 LAB — COMPREHENSIVE METABOLIC PANEL
ALBUMIN: 4.4 g/dL (ref 3.5–5.0)
ALT: 19 U/L (ref 14–54)
ANION GAP: 9 (ref 5–15)
AST: 21 U/L (ref 15–41)
Alkaline Phosphatase: 78 U/L (ref 38–126)
BUN: 10 mg/dL (ref 6–20)
CHLORIDE: 102 mmol/L (ref 101–111)
CO2: 26 mmol/L (ref 22–32)
Calcium: 9.8 mg/dL (ref 8.9–10.3)
Creatinine, Ser: 0.87 mg/dL (ref 0.44–1.00)
GFR calc non Af Amer: >60 mL/min (ref >60)
GLUCOSE: 97 mg/dL (ref 65–99)
POTASSIUM: 3.5 mmol/L (ref 3.5–5.1)
SODIUM: 137 mmol/L (ref 135–145)
Total Bilirubin: 0.7 mg/dL (ref 0.3–1.2)
Total Protein: 7.7 g/dL (ref 6.5–8.1)

## 2017-02-07 LAB — TROPONIN I: Troponin I: <0.03 ng/mL (ref <0.03)

## 2017-02-07 MED ORDER — LORAZEPAM 0.5 MG PO TABS
0.5000 mg | ORAL_TABLET | Freq: Once | ORAL | Status: AC
  Administered 2017-02-07: 0.5 mg via ORAL

## 2017-02-07 MED ORDER — LORAZEPAM 0.5 MG PO TABS
ORAL_TABLET | ORAL | Status: AC
  Filled 2017-02-07: qty 1

## 2017-02-07 NOTE — ED Notes (Signed)
First nurse note  Presents with dizziness and elevated blood pressure

## 2017-02-07 NOTE — ED Notes (Signed)

## 2017-02-07 NOTE — ED Triage Notes (Signed)
Patient reports having elevated b/p of 213/110 and after talking with PCP they told her to come to ER. C/o lightheadedness and dizziness at this time. Denies blurred vision

## 2017-02-07 NOTE — ED Provider Notes (Signed)
[facility] Emergency Department Provider Note    First MD Initiated Contact with Patient 02/07/17 1747     (approximate)  I have reviewed the triage vital signs and the nursing notes.   HISTORY  Chief Complaint Hypertension    HPI Carolyn Frye is a 76 y.o. female presents with chief complaint of lightheadedness dizziness and elevated blood pressure. Patient is been struggling with these symptoms for the past several weeks largely related to some increased him stressors. Patient states that she is trying to figure out how to care for her daughter and grand daughter as her daughter is now coming out of rehabilitation. This causing quite a great deal of stress for her. She is also admits to recent medication changes to treat her blood pressure. She presented to the ER the first of this month with identical symptoms. She had an extensive workup which was unrevealing. States that today she was having similar symptoms and feeling stressed out she checked her blood pressure and it was in the 200s over 100s. She denied any shortness of breath or chest pain. She has discussed options for anxiety medications with her primary care physician but has not wanted to start this medication. She denies any numbness or tingling. No fevers. No abdominal pain. No lower extremity swelling.   Past Medical History:  Diagnosis Date  . Arthritis    hands, feet  . Cancer [facility]) 2007   kidney  . Cancer ([place]) 2014   melanoma  . Diverticulosis   . GERD (gastroesophageal reflux disease)   . Hypercholesteremia   . Hypertension   . MRSA (methicillin resistant Staphylococcus aureus) 03/03/2009   foot wound - treated  . Osteoporosis   . Seasonal allergies   . Wears contact lenses   . Wears dentures    partial bottom   Family History  Problem Relation Age of Onset  . Breast cancer Neg Hx    Past Surgical History:  Procedure Laterality Date  . ABDOMINAL HYSTERECTOMY    .  BREAST BIOPSY Left 1985   neg  . BREAST CYST ASPIRATION Left 1993   lt fna-neg  . CATARACT EXTRACTION W/PHACO Left 02/03/2015   Procedure: CATARACT EXTRACTION PHACO AND INTRAOCULAR LENS PLACEMENT (IOC);  Surgeon: Leandrew Koyanagi, MD;  Location: [place];  Service: Ophthalmology;  Laterality: Left;  . CATARACT EXTRACTION W/PHACO Right 03/10/2015   Procedure: CATARACT EXTRACTION PHACO AND INTRAOCULAR LENS PLACEMENT (IOC);  Surgeon: Leandrew Koyanagi, MD;  Location: [place];  Service: Ophthalmology;  Laterality: Right;  . CHOLECYSTECTOMY    . COLONOSCOPY    . FOOT SURGERY     for MRSA  . NEPHRECTOMY Right 2007  . SKIN CANCER EXCISION    . VEIN LIGATION Right    Patient Active Problem List   Diagnosis Date Noted  . Chest pain 09/21/2016      Prior to Admission medications   Medication Sig Start Date End Date Taking? Authorizing Provider  amLODipine (NORVASC) 10 MG tablet Take 10 mg by mouth daily. AM    [provider]  ergocalciferol (VITAMIN D2) 50000 UNITS capsule Take 50,000 Units by mouth once a week.    [provider]  hydrALAZINE (APRESOLINE) 25 MG tablet Take 25 mg by mouth every 8 (eight) hours.  09/18/16   [provider]  hydrochlorothiazide (HYDRODIURIL) 12.5 MG tablet Take 12.5 mg by mouth daily. AM    [provider]  labetalol (NORMODYNE) 100 MG tablet Take 100 mg  by mouth 2 (two) times daily.    [provider]  loratadine (CLARITIN) 10 MG tablet Take 10 mg by mouth daily.    [provider]  omeprazole (PRILOSEC) 20 MG capsule Take 20 mg by mouth daily. AM    [provider]  rosuvastatin (CRESTOR) 10 MG tablet Take 10 mg by mouth daily. AM    [provider]    Allergies Ciprofloxacin; Dilaudid [hydromorphone]; Macrobid [nitrofurantoin]; Sulfa antibiotics; and Levaquin [levofloxacin]    Social History Social History  Substance Use Topics  . Smoking status: Never  Smoker  . Smokeless tobacco: Never Used  . Alcohol use No    Review of Systems Patient denies headaches, rhinorrhea, blurry vision, numbness, shortness of breath, chest pain, edema, cough, abdominal pain, nausea, vomiting, diarrhea, dysuria, fevers, rashes or hallucinations unless otherwise stated above in HPI. ____________________________________________   PHYSICAL EXAM:  VITAL SIGNS: Vitals:   02/07/17 1733  BP: (!) 157/87  Pulse: 78  Resp: 18  Temp: 98.4 F (36.9 C)  SpO2: 99%    Constitutional: Alert and oriented. Anxious appearing and in no acute distress. Eyes: Conjunctivae are normal.  Head: Atraumatic. Nose: No congestion/rhinnorhea. Mouth/Throat: Mucous membranes are moist.   Neck: No stridor. Painless ROM.  Cardiovascular: Normal rate, regular rhythm. Grossly normal heart sounds.  Good peripheral circulation. Respiratory: Normal respiratory effort.  No retractions. Lungs CTAB. Gastrointestinal: Soft and nontender. No distention. No abdominal bruits. No CVA tenderness. Genitourinary:  Musculoskeletal: No lower extremity tenderness nor edema.  No joint effusions. Neurologic:  CN- intact.  No facial droop, Normal FNF.  Normal heel to shin.  Sensation intact bilaterally. Normal speech and language. No gross focal neurologic deficits are appreciated. No gait instability. Skin:  Skin is warm, dry and intact. No rash noted. Psychiatric: tearful and anxious appearing, ____________________________________________   LABS (all labs ordered are listed, but only abnormal results are displayed)  Results for orders placed or performed during the hospital encounter of 02/07/17 (from the past 24 hour(s))  CBC with Differential     Status: Abnormal   Collection Time: 02/07/17  5:38 PM  Result Value Ref Range   WBC 6.5 3.6 - 11.0 K/uL   RBC 5.02 3.80 - 5.20 MIL/uL   Hemoglobin 14.4 12.0 - 16.0 g/dL   HCT 41.9 35.0 - 47.0 %   MCV 83.4 80.0 - 100.0 fL   MCH 28.7 26.0 - 34.0 pg    MCHC 34.4 32.0 - 36.0 g/dL   RDW 14.9 (H) 11.5 - 14.5 %   Platelets 244 150 - 440 K/uL   Neutrophils Relative % 64 %   Neutro Abs 4.2 1.4 - 6.5 K/uL   Lymphocytes Relative 21 %   Lymphs Abs 1.3 1.0 - 3.6 K/uL   Monocytes Relative 11 %   Monocytes Absolute 0.7 0.2 - 0.9 K/uL   Eosinophils Relative 3 %   Eosinophils Absolute 0.2 0 - 0.7 K/uL   Basophils Relative 1 %   Basophils Absolute 0.1 0 - 0.1 K/uL  Comprehensive metabolic panel     Status: None   Collection Time: 02/07/17  5:38 PM  Result Value Ref Range   Sodium 137 135 - 145 mmol/L   Potassium 3.5 3.5 - 5.1 mmol/L   Chloride 102 101 - 111 mmol/L   CO2 26 22 - 32 mmol/L   Glucose, Bld 97 65 - 99 mg/dL   BUN 10 6 - 20 mg/dL   Creatinine, Ser 0.87 0.44 - 1.00 mg/dL  Calcium 9.8 8.9 - 10.3 mg/dL   Total Protein 7.7 6.5 - 8.1 g/dL   Albumin 4.4 3.5 - 5.0 g/dL   AST 21 15 - 41 U/L   ALT 19 14 - 54 U/L   Alkaline Phosphatase 78 38 - 126 U/L   Total Bilirubin 0.7 0.3 - 1.2 mg/dL   GFR calc non Af Amer >60 >60 mL/min   GFR calc Af Amer >60 >60 mL/min   Anion gap 9 5 - 15  Troponin I     Status: None   Collection Time: 02/07/17  5:38 PM  Result Value Ref Range   Troponin I <0.03 <0.03 ng/mL  Urinalysis, Complete w Microscopic     Status: Abnormal   Collection Time: 02/07/17  6:34 PM  Result Value Ref Range   Color, Urine COLORLESS (A) YELLOW   APPearance CLEAR (A) CLEAR   Specific Gravity, Urine 1.002 (L) 1.005 - 1.030   pH 8.0 5.0 - 8.0   Glucose, UA NEGATIVE NEGATIVE mg/dL   Hgb urine dipstick SMALL (A) NEGATIVE   Bilirubin Urine NEGATIVE NEGATIVE   Ketones, ur NEGATIVE NEGATIVE mg/dL   Protein, ur NEGATIVE NEGATIVE mg/dL   Nitrite NEGATIVE NEGATIVE   Leukocytes, UA NEGATIVE NEGATIVE   RBC / HPF 0-5 0 - 5 RBC/hpf   WBC, UA NONE SEEN 0 - 5 WBC/hpf   Bacteria, UA NONE SEEN NONE SEEN   Squamous Epithelial / LPF NONE SEEN NONE SEEN   ____________________________________________  EKG My review and personal  interpretation at Time: 17:37   Indication: dizziness  Rate: 75  Rhythm: sinus Axis: normal Other: normal intervals, no stemi, no depression ____________________________________________  RADIOLOGY  I personally reviewed all radiographic images ordered to evaluate for the above acute complaints and reviewed radiology reports and findings.  These findings were personally discussed with the patient.  Please see medical record for radiology report.  ____________________________________________   PROCEDURES  Procedure(s) performed:  Procedures    Critical Care performed: no ____________________________________________   INITIAL IMPRESSION / ASSESSMENT AND PLAN / ED COURSE  Pertinent labs & imaging results that were available during my care of the patient were reviewed by me and considered in my medical decision making (see chart for details).  DDX: htn, anxiety, chf, electrolyte abn, vertigo, cva  WINFRED REDEL is a 76 y.o. who presents to the ED with Non-distressed patient presenting with concern for elevated BP lightheadedness and dizziness. Patient is AF,VSS with HTN in ED. Exam as above. Given current presentation have considered the above differential. No report of missed antihypertensive doses or medical non-compliance. no report of illicit drug use that could elevate BP.  Extensive evaluation of possible end organ damage pursued in ED. no evidence of acute renal dysfunction. Neuro exam with no focal deficits. EKG with no evidence of ischemia. Trop negative. Renal function normal. Not consistent with CHF, malignant htn, adrenergic crisis or hypertensive emergency.  Patient was significant improvement after low dose of Ativan. I do feel that there is a large component of stress induced anxiety. Had long discussion with patient regarding options for improvement in her symptoms and quality of life. Will not make any changes to her antihypertensive medications as her blood pressure  is appropriate at this point. She is stable for follow-up with her PCP.       ____________________________________________   FINAL CLINICAL IMPRESSION(S) / ED DIAGNOSES  Final diagnoses:  Hypertension, unspecified type  Dizziness      NEW MEDICATIONS STARTED DURING THIS  VISIT:  New Prescriptions   No medications on file     Note:  This document was prepared using Dragon voice recognition software and may include unintentional dictation errors.    Merlyn Lot, MD 02/07/17 Sharilyn Sites

## 2017-06-06 ENCOUNTER — Encounter: Payer: Self-pay | Admitting: Urology

## 2017-06-06 ENCOUNTER — Ambulatory Visit (INDEPENDENT_AMBULATORY_CARE_PROVIDER_SITE_OTHER): Payer: Medicare Other | Admitting: Urology

## 2017-06-06 VITALS — BP 127/76 | HR 72 | Ht 66.0 in | Wt 150.4 lb

## 2017-06-06 DIAGNOSIS — R3129 Other microscopic hematuria: Secondary | ICD-10-CM | POA: Diagnosis not present

## 2017-06-06 DIAGNOSIS — N390 Urinary tract infection, site not specified: Secondary | ICD-10-CM

## 2017-06-06 DIAGNOSIS — Z85528 Personal history of other malignant neoplasm of kidney: Secondary | ICD-10-CM

## 2017-06-06 LAB — URINALYSIS, COMPLETE
Bilirubin, UA: NEGATIVE
Glucose, UA: NEGATIVE
KETONES UA: NEGATIVE
Leukocytes, UA: NEGATIVE
Nitrite, UA: NEGATIVE
PH UA: 6 (ref 5.0–7.5)
PROTEIN UA: NEGATIVE
Specific Gravity, UA: 1.02 (ref 1.005–1.030)
UUROB: 0.2 mg/dL (ref 0.2–1.0)

## 2017-06-06 LAB — MICROSCOPIC EXAMINATION
BACTERIA UA: NONE SEEN
EPITHELIAL CELLS (NON RENAL): NONE SEEN /HPF (ref 0–10)
WBC UA: NONE SEEN /HPF (ref 0–?)

## 2017-06-06 MED ORDER — ESTROGENS, CONJUGATED 0.625 MG/GM VA CREA: TOPICAL_CREAM | VAGINAL | 1 refills | Status: DC

## 2017-06-06 NOTE — Progress Notes (Signed)
06/06/2017 9:10 AM   Carolyn Frye 1940/09/02 [id number]  Referring provider: Dion Body, MD [facility] [place], [place] [address]  Chief Complaint  Patient presents with  . Recurrent UTI    HPI: Carolyn Frye is a 76 year old female seen at the request of Dr. Netty Starring for evaluation of recurrent UTI.  She has a history of renal cell carcinoma status post right nephrectomy at [facility] in 2006.  She was last seen at [facility] in 2016 and released to prn follow-up.  Her creatinine has been stable and was 0.8 in August 2018.  She presents today with a six-month history of recurrent UTIs.  She had positive urine cultures for E. coli in May and December 2018 and scant growth of MRSA and coagulase-negative staph in July 2018.  Her typical symptoms are urinary frequency, urgency, dysuria and bladder cramping.  She denies fever, chills or flank/abdominal pain.  With her most recent infection she was on doxycycline however was having persistent symptoms and is completing a course of Ceftin with resolution of her symptoms.   PMH: Past Medical History:  Diagnosis Date  . Arthritis    hands, feet  . Cancer [facility]) 2007   kidney  . Cancer ([place]) 2014   melanoma  . Diverticulosis   . GERD (gastroesophageal reflux disease)   . Hypercholesteremia   . Hypertension   . MRSA (methicillin resistant Staphylococcus aureus) 03/03/2009   foot wound - treated  . Osteoporosis   . Seasonal allergies   . Wears contact lenses   . Wears dentures    partial bottom    Surgical History: Past Surgical History:  Procedure Laterality Date  . ABDOMINAL HYSTERECTOMY    . BREAST BIOPSY Left 1985   neg  . BREAST CYST ASPIRATION Left 1993   lt fna-neg  . CATARACT EXTRACTION W/PHACO Left 02/03/2015   Procedure: CATARACT EXTRACTION PHACO AND INTRAOCULAR LENS PLACEMENT (IOC);  Surgeon: Leandrew Koyanagi, MD;  Location: [place];  Service: Ophthalmology;   Laterality: Left;  . CATARACT EXTRACTION W/PHACO Right 03/10/2015   Procedure: CATARACT EXTRACTION PHACO AND INTRAOCULAR LENS PLACEMENT (IOC);  Surgeon: Leandrew Koyanagi, MD;  Location: [place];  Service: Ophthalmology;  Laterality: Right;  . CHOLECYSTECTOMY    . COLONOSCOPY    . FOOT SURGERY     for MRSA  . NEPHRECTOMY Right 2007  . SKIN CANCER EXCISION    . VEIN LIGATION Right     Home Medications:  Allergies as of 06/06/2017      Reactions   Ciprofloxacin Other (See Comments)   Shoulder pain, tendon tear   Dilaudid [hydromorphone] Other (See Comments)   Severe headache   Macrobid [nitrofurantoin] Other (See Comments)   Red, burning eyes   Sulfa Antibiotics Other (See Comments)   Levaquin [levofloxacin] Anxiety      Medication List        Accurate as of 06/06/17  9:10 AM. Always use your most recent med list.          amLODipine 10 MG tablet Commonly known as:  NORVASC Take 10 mg by mouth daily. AM   cefUROXime 250 MG tablet Commonly known as:  CEFTIN Take 250 mg by mouth 2 (two) times daily with a meal.   conjugated estrogens vaginal cream Commonly known as:  PREMARIN Apply pea sized amount to vaginal area by fingertip twice weekly   ergocalciferol 50000 units capsule Commonly known as:  VITAMIN D2 Take 50,000 Units by mouth once  a week.   hydrALAZINE 25 MG tablet Commonly known as:  APRESOLINE Take 25 mg by mouth every 8 (eight) hours.   hydrochlorothiazide 12.5 MG tablet Commonly known as:  HYDRODIURIL Take 12.5 mg by mouth daily. AM   labetalol 100 MG tablet Commonly known as:  NORMODYNE Take 100 mg by mouth 2 (two) times daily.   loratadine 10 MG tablet Commonly known as:  CLARITIN Take 10 mg by mouth daily.   omeprazole 20 MG capsule Commonly known as:  PRILOSEC Take 20 mg by mouth daily. AM   rosuvastatin 10 MG tablet Commonly known as:  CRESTOR Take 10 mg by mouth daily. AM       Allergies:  Allergies  Allergen  Reactions  . Ciprofloxacin Other (See Comments)    Shoulder pain, tendon tear  . Dilaudid [Hydromorphone] Other (See Comments)    Severe headache  . Macrobid [Nitrofurantoin] Other (See Comments)    Red, burning eyes  . Sulfa Antibiotics Other (See Comments)  . Levaquin [Levofloxacin] Anxiety    Family History: Family History  Problem Relation Age of Onset  . Breast cancer Neg Hx   . Bladder Cancer Neg Hx   . Kidney cancer Neg Hx     Social History:  reports that  has never smoked. she has never used smokeless tobacco. She reports that she does not drink alcohol. Her drug history is not on file.  ROS: UROLOGY Frequent Urination?: Yes Hard to postpone urination?: No Burning/pain with urination?: Yes Get up at night to urinate?: Yes Leakage of urine?: No Urine stream starts and stops?: No Trouble starting stream?: No Do you have to strain to urinate?: No Blood in urine?: No Urinary tract infection?: Yes Sexually transmitted disease?: No Injury to kidneys or bladder?: No Painful intercourse?: No Weak stream?: No Currently pregnant?: No Vaginal bleeding?: No Last menstrual period?: n  Gastrointestinal Nausea?: No Vomiting?: No Indigestion/heartburn?: Yes Diarrhea?: No Constipation?: No  Constitutional Fever: No Night sweats?: No Weight loss?: Yes Fatigue?: Yes  Skin Skin rash/lesions?: No Itching?: No  Eyes Blurred vision?: No Double vision?: No  Ears/Nose/Throat Sore throat?: No Sinus problems?: Yes  Hematologic/Lymphatic Swollen glands?: No Easy bruising?: Yes  Cardiovascular Leg swelling?: Yes Chest pain?: No  Respiratory Cough?: Yes Shortness of breath?: No  Endocrine Excessive thirst?: No  Musculoskeletal Back pain?: Yes Joint pain?: Yes  Neurological Headaches?: No Dizziness?: Yes  Psychologic Depression?: No Anxiety?: Yes  Physical Exam: BP 127/76 (BP Location: Right Arm, Patient Position: Sitting, Cuff Size: Normal)    Pulse 72   Ht 5\' 6"  (1.676 m)   Wt 150 lb 6.4 oz (68.2 kg)   BMI 24.28 kg/m   Constitutional:  Alert and oriented, No acute distress. HEENT: [place] AT, moist mucus membranes.  Trachea midline, no masses. Cardiovascular: No clubbing, cyanosis, or edema. Respiratory: Normal respiratory effort, no increased work of breathing. GI: Abdomen is soft, nontender, nondistended, no abdominal masses GU: No CVA tenderness.  Skin: No rashes, bruises or suspicious lesions. Lymph: No cervical or inguinal adenopathy. Neurologic: Grossly intact, no focal deficits, moving all 4 extremities. Psychiatric: Normal mood and affect.  Laboratory Data: Lab Results  Component Value Date   WBC 6.5 02/07/2017   HGB 14.4 02/07/2017   HCT 41.9 02/07/2017   MCV 83.4 02/07/2017   PLT 244 02/07/2017    Lab Results  Component Value Date   CREATININE 0.87 02/07/2017    Urinalysis Dipstick: trace intact blood Microscopy:3-10 RBC  Assessment & Plan:  1. Recurrent UTI Urinalysis today shows resolution of her pyuria however she has 3-10 RBCs.  Will schedule a renal ultrasound and cystoscopy. Recommended starting cranberry extract tablets and low-dose vaginal estrogen.  She was given a sample tube of Premarin and Rx was sent to her pharmacy.  - Urinalysis, Complete - Ultrasound renal complete; Future  2. Personal history of malignant neoplasm of kidney  - Ultrasound renal complete; Future  3. Microhematuria As above   Return in about 4 weeks (around 07/04/2017) for Cystoscopy.   Abbie Sons, [place] [address] [place], [place] [address] [phone]

## 2017-06-14 ENCOUNTER — Ambulatory Visit (INDEPENDENT_AMBULATORY_CARE_PROVIDER_SITE_OTHER): Payer: Medicare Other

## 2017-06-14 VITALS — BP 145/71 | HR 78 | Ht 66.0 in | Wt 148.0 lb

## 2017-06-14 DIAGNOSIS — N390 Urinary tract infection, site not specified: Secondary | ICD-10-CM

## 2017-06-14 LAB — MICROSCOPIC EXAMINATION: EPITHELIAL CELLS (NON RENAL): NONE SEEN /HPF (ref 0–10)

## 2017-06-14 LAB — URINALYSIS, COMPLETE
BILIRUBIN UA: NEGATIVE
Ketones, UA: NEGATIVE
Nitrite, UA: POSITIVE — AB
PH UA: 5 (ref 5.0–7.5)
Protein, UA: NEGATIVE
Specific Gravity, UA: 1.01 (ref 1.005–1.030)
Urobilinogen, Ur: 1 mg/dL (ref 0.2–1.0)

## 2017-06-14 NOTE — Progress Notes (Signed)
Pt presents today with c/o urinary frequency, urgency, and dysuria. Pt stated that she is currently on AZO.  Pt has been on multiple abx in the past month given by [facility], doxycyline and ceftin.  Pt states that she has noticed a correlation of intercourse and UTIs.  A clean catch was obtained for u/a and cx today.   Blood pressure (!) 145/71, pulse 78, height 5\' 6"  (1.676 m), weight 148 lb (67.1 kg).

## 2017-06-18 ENCOUNTER — Telehealth: Payer: Self-pay | Admitting: Urology

## 2017-06-18 NOTE — Telephone Encounter (Signed)
Patient called and left a voice mail message inquiring about her urine culture results.  Please call her at [phone].

## 2017-06-20 LAB — CULTURE, URINE COMPREHENSIVE

## 2017-06-20 MED ORDER — CEPHALEXIN 500 MG PO CAPS: 500.0000 mg | ORAL_CAPSULE | Freq: Two times a day (BID) | ORAL | 0 refills | Status: DC

## 2017-06-20 NOTE — Telephone Encounter (Signed)
Per Larene [place] keflex 500mg  bid x7 days. Spoke with pt and made aware of +ucx and abx. Pt voiced understanding. Abx sent to pharmacy.

## 2017-06-25 ENCOUNTER — Ambulatory Visit
Admission: RE | Admit: 2017-06-25 | Discharge: 2017-06-25 | Disposition: A | Discharge status: Home / Self Care | Payer: Medicare Other | Source: Ambulatory Visit | Attending: Urology | Admitting: Urology

## 2017-06-25 DIAGNOSIS — N281 Cyst of kidney, acquired: Secondary | ICD-10-CM | POA: Insufficient documentation

## 2017-06-25 DIAGNOSIS — N390 Urinary tract infection, site not specified: Secondary | ICD-10-CM | POA: Insufficient documentation

## 2017-06-25 DIAGNOSIS — Z85528 Personal history of other malignant neoplasm of kidney: Secondary | ICD-10-CM | POA: Diagnosis present

## 2017-06-25 DIAGNOSIS — Z905 Acquired absence of kidney: Secondary | ICD-10-CM | POA: Diagnosis not present

## 2017-06-27 ENCOUNTER — Encounter: Payer: Self-pay | Admitting: Urology

## 2017-06-27 ENCOUNTER — Ambulatory Visit (INDEPENDENT_AMBULATORY_CARE_PROVIDER_SITE_OTHER): Payer: Medicare Other | Admitting: Urology

## 2017-06-27 VITALS — BP 122/68 | HR 71 | Ht 66.0 in | Wt 149.4 lb

## 2017-06-27 DIAGNOSIS — R3129 Other microscopic hematuria: Secondary | ICD-10-CM

## 2017-06-27 LAB — URINALYSIS, COMPLETE
Bilirubin, UA: NEGATIVE
Glucose, UA: NEGATIVE
Ketones, UA: NEGATIVE
Leukocytes, UA: NEGATIVE
NITRITE UA: NEGATIVE
PH UA: 7 (ref 5.0–7.5)
Protein, UA: NEGATIVE
Specific Gravity, UA: 1.02 (ref 1.005–1.030)
UUROB: 0.2 mg/dL (ref 0.2–1.0)

## 2017-06-27 LAB — MICROSCOPIC EXAMINATION
Bacteria, UA: NONE SEEN
EPITHELIAL CELLS (NON RENAL): NONE SEEN /HPF (ref 0–10)
WBC UA: NONE SEEN /HPF (ref 0–?)

## 2017-06-27 MED ORDER — DOXYCYCLINE HYCLATE 100 MG PO TABS
100.0000 mg | ORAL_TABLET | Freq: Once | ORAL | Status: AC
Start: 2017-06-27 — End: 2017-06-27
  Administered 2017-06-27: 100 mg via ORAL

## 2017-06-27 MED ORDER — DOXYCYCLINE HYCLATE 100 MG PO CAPS: ORAL_CAPSULE | ORAL | 0 refills | Status: DC

## 2017-06-27 MED ORDER — LIDOCAINE HCL 2 % EX GEL
1.0000 "application " | Freq: Once | CUTANEOUS | Status: AC
  Administered 2017-06-27: 1 via URETHRAL

## 2017-06-27 NOTE — Progress Notes (Signed)
   06/27/17  CC:  Chief Complaint  Patient presents with  . Cysto    HPI: Refer to my previous note on 06/06/2017.  She was also found to have microhematuria.  Renal ultrasound showed a stable 14 mm renal cyst unchanged from 2011.  Blood pressure 122/68, pulse 71, height 5\' 6"  (1.676 m), weight 149 lb 6.4 oz (67.8 kg). NED. A&Ox3.   No respiratory distress   Abd soft, NT, ND Atrophic external genitalia with patent urethral meatus  Cystoscopy Procedure Note  Patient identification was confirmed, informed consent was obtained, and patient was prepped using Betadine solution.  Lidocaine jelly was administered per urethral meatus.    Preoperative abx where received prior to procedure.    Procedure: - Flexible cystoscope introduced, without any difficulty.   - Thorough search of the bladder revealed:    normal urethral meatus    normal urothelium    no stones    no ulcers     no tumors    no urethral polyps    no trabeculation  - Ureteral orifices were normal in position and appearance.  Post-Procedure: - Patient tolerated the procedure well  Assessment/ Plan: No significant abnormalities on cystoscopy or renal ultrasound.  She gives additional history that her infections do tend to occur after intercourse and will place on postcoital antibiotic prophylaxis.  She has a follow-up scheduled late March 2019   Abbie Sons, MD

## 2017-06-29 ENCOUNTER — Other Ambulatory Visit: Payer: Self-pay | Admitting: Family Medicine

## 2017-06-29 DIAGNOSIS — Z1231 Encounter for screening mammogram for malignant neoplasm of breast: Secondary | ICD-10-CM

## 2017-07-03 ENCOUNTER — Ambulatory Visit
Admission: RE | Admit: 2017-07-03 | Discharge: 2017-07-03 | Disposition: A | Discharge status: Home / Self Care | Payer: Medicare Other | Source: Ambulatory Visit | Attending: Family Medicine | Admitting: Family Medicine

## 2017-07-03 DIAGNOSIS — Z1231 Encounter for screening mammogram for malignant neoplasm of breast: Secondary | ICD-10-CM | POA: Insufficient documentation

## 2017-07-09 DIAGNOSIS — Z7189 Other specified counseling: Secondary | ICD-10-CM | POA: Insufficient documentation

## 2017-07-09 DIAGNOSIS — Z7185 Encounter for immunization safety counseling: Secondary | ICD-10-CM | POA: Insufficient documentation

## 2017-09-05 ENCOUNTER — Encounter: Payer: Self-pay | Admitting: Urology

## 2017-09-05 ENCOUNTER — Ambulatory Visit (INDEPENDENT_AMBULATORY_CARE_PROVIDER_SITE_OTHER): Payer: Medicare Other | Admitting: Urology

## 2017-09-05 VITALS — BP 122/70 | HR 71 | Ht 66.0 in | Wt 147.6 lb

## 2017-09-05 DIAGNOSIS — R3129 Other microscopic hematuria: Secondary | ICD-10-CM

## 2017-09-05 LAB — URINALYSIS, COMPLETE
Bilirubin, UA: NEGATIVE
GLUCOSE, UA: NEGATIVE
KETONES UA: NEGATIVE
Leukocytes, UA: NEGATIVE
NITRITE UA: NEGATIVE
Protein, UA: NEGATIVE
SPEC GRAV UA: 1.01 (ref 1.005–1.030)
Urobilinogen, Ur: 1 mg/dL (ref 0.2–1.0)
pH, UA: 7 (ref 5.0–7.5)

## 2017-09-05 LAB — MICROSCOPIC EXAMINATION
Bacteria, UA: NONE SEEN
Epithelial Cells (non renal): NONE SEEN /hpf (ref 0–10)
WBC, UA: NONE SEEN /hpf (ref 0–?)

## 2017-09-05 MED ORDER — DOXYCYCLINE HYCLATE 100 MG PO CAPS: ORAL_CAPSULE | ORAL | 1 refills | Status: DC

## 2017-09-05 NOTE — Progress Notes (Signed)
09/05/2017 9:15 AM   Carolyn Frye 02-03-1941 [id number]  Referring provider: Dion Body, MD [facility] [place], [place] [address]  Chief Complaint  Patient presents with  . Hematuria    HPI: 77 year old female presents for follow-up of recurrent UTIs..  She has a history of renal cell carcinoma status post nephrectomy in 2006.  Refer to my prior note of 06/06/2017.  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  Cystoscopy was unremarkable.  Her UTIs did occur after intercourse and she was placed on postcoital prophylaxis with doxycycline.  She states since starting this regimen is she has not had any further infections.  She has no complaints today.   PMH: Past Medical History:  Diagnosis Date  . Arthritis    hands, feet  . Cancer [facility]) 2007   kidney  . Cancer ([place]) 2014   melanoma  . Diverticulosis   . GERD (gastroesophageal reflux disease)   . Hypercholesteremia   . Hypertension   . MRSA (methicillin resistant Staphylococcus aureus) 03/03/2009   foot wound - treated  . Osteoporosis   . Seasonal allergies   . Wears contact lenses   . Wears dentures    partial bottom    Surgical History: Past Surgical History:  Procedure Laterality Date  . ABDOMINAL HYSTERECTOMY    . BREAST BIOPSY Left 1985   neg  . BREAST CYST ASPIRATION Left 1993   lt fna-neg  . CATARACT EXTRACTION W/PHACO Left 02/03/2015   Procedure: CATARACT EXTRACTION PHACO AND INTRAOCULAR LENS PLACEMENT (IOC);  Surgeon: Leandrew Koyanagi, MD;  Location: [place];  Service: Ophthalmology;  Laterality: Left;  . CATARACT EXTRACTION W/PHACO Right 03/10/2015   Procedure: CATARACT EXTRACTION PHACO AND INTRAOCULAR LENS PLACEMENT (IOC);  Surgeon: Leandrew Koyanagi, MD;  Location: [place];  Service: Ophthalmology;  Laterality: Right;  . CHOLECYSTECTOMY    . COLONOSCOPY    . FOOT SURGERY     for MRSA  . NEPHRECTOMY Right 2007  . SKIN  CANCER EXCISION    . VEIN LIGATION Right     Home Medications:  Allergies as of 09/05/2017      Reactions   Ciprofloxacin Other (See Comments)   Shoulder pain, tendon tear   Dilaudid [hydromorphone] Other (See Comments)   Severe headache   Macrobid [nitrofurantoin] Other (See Comments)   Red, burning eyes   Sulfa Antibiotics Other (See Comments)   Levaquin [levofloxacin] Anxiety      Medication List        Accurate as of 09/05/17  9:15 AM. Always use your most recent med list.          amLODipine 10 MG tablet Commonly known as:  NORVASC Take 10 mg by mouth daily. AM   conjugated estrogens vaginal cream Commonly known as:  PREMARIN Apply pea sized amount to vaginal area by fingertip twice weekly   doxycycline 100 MG capsule Commonly known as:  VIBRAMYCIN 100 mg po after intercourse   ergocalciferol 50000 units capsule Commonly known as:  VITAMIN D2 Take 50,000 Units by mouth once a week.   hydrALAZINE 25 MG tablet Commonly known as:  APRESOLINE Take 25 mg by mouth every 8 (eight) hours.   hydrochlorothiazide 12.5 MG tablet Commonly known as:  HYDRODIURIL Take 12.5 mg by mouth daily. AM   labetalol 100 MG tablet Commonly known as:  NORMODYNE Take 100 mg by mouth 2 (two) times daily.   loratadine 10 MG tablet Commonly known as:  CLARITIN Take  10 mg by mouth daily.   omeprazole 20 MG capsule Commonly known as:  PRILOSEC Take 20 mg by mouth daily. AM   rosuvastatin 10 MG tablet Commonly known as:  CRESTOR Take 10 mg by mouth daily. AM       Allergies:  Allergies  Allergen Reactions  . Ciprofloxacin Other (See Comments)    Shoulder pain, tendon tear  . Dilaudid [Hydromorphone] Other (See Comments)    Severe headache  . Macrobid [Nitrofurantoin] Other (See Comments)    Red, burning eyes  . Sulfa Antibiotics Other (See Comments)  . Levaquin [Levofloxacin] Anxiety    Family History: Family History  Problem Relation Age of Onset  . Breast cancer  Neg Hx   . Bladder Cancer Neg Hx   . Kidney cancer Neg Hx     Social History:  reports that  has never smoked. she has never used smokeless tobacco. She reports that she does not drink alcohol. Her drug history is not on file.  ROS: UROLOGY Frequent Urination?: No Hard to postpone urination?: No Burning/pain with urination?: No Get up at night to urinate?: Yes Leakage of urine?: No Urine stream starts and stops?: No Trouble starting stream?: No Do you have to strain to urinate?: No Blood in urine?: No Urinary tract infection?: No Sexually transmitted disease?: No Injury to kidneys or bladder?: No Painful intercourse?: No Weak stream?: No Currently pregnant?: No Vaginal bleeding?: No Last menstrual period?: n  Gastrointestinal Nausea?: No Vomiting?: No Indigestion/heartburn?: No Diarrhea?: No Constipation?: No  Constitutional Fever: No Night sweats?: No Weight loss?: No Fatigue?: No  Skin Skin rash/lesions?: No Itching?: No  Eyes Blurred vision?: No Double vision?: No  Ears/Nose/Throat Sore throat?: No Sinus problems?: No  Hematologic/Lymphatic Swollen glands?: No Easy bruising?: No  Cardiovascular Leg swelling?: No Chest pain?: No  Respiratory Cough?: No Shortness of breath?: No  Endocrine Excessive thirst?: No  Musculoskeletal Back pain?: No Joint pain?: No  Neurological Headaches?: No Dizziness?: No  Psychologic Depression?: No Anxiety?: No  Physical Exam: BP 122/70 (BP Location: Right Arm, Patient Position: Sitting, Cuff Size: Normal)   Pulse 71   Ht 5\' 6"  (1.676 m)   Wt 147 lb 9.6 oz (67 kg)   BMI 23.82 kg/m   Constitutional:  Alert and oriented, No acute distress. HEENT: [place] AT, moist mucus membranes.  Trachea midline, no masses. Cardiovascular: No clubbing, cyanosis, or edema. Respiratory: Normal respiratory effort, no increased work of breathing. GI: Abdomen is soft, nontender, nondistended, no abdominal masses GU: No  CVA tenderness Lymph: No cervical or inguinal lymphadenopathy. Skin: No rashes, bruises or suspicious lesions. Neurologic: Grossly intact, no focal deficits, moving all 4 extremities. Psychiatric: Normal mood and affect.  Laboratory Data: Lab Results  Component Value Date   WBC 6.5 02/07/2017   HGB 14.4 02/07/2017   HCT 41.9 02/07/2017   MCV 83.4 02/07/2017   PLT 244 02/07/2017    Lab Results  Component Value Date   CREATININE 0.87 02/07/2017    Urinalysis Dipstick trace blood, negative leukocytes Microscopy 3-10 RBC  Assessment & Plan:   77 year old female with recurrent postcoital infections which have resolved with postcoital prophylaxis.  Doxycycline was refilled.  She has stable microhematuria.  Follow-up annually.   Abbie Sons, [place] [address] [place], [place] [address] [phone]

## 2017-10-01 ENCOUNTER — Ambulatory Visit (INDEPENDENT_AMBULATORY_CARE_PROVIDER_SITE_OTHER): Payer: Medicare Other | Admitting: Family Medicine

## 2017-10-01 ENCOUNTER — Encounter: Payer: Self-pay | Admitting: Family Medicine

## 2017-10-01 VITALS — BP 132/79 | HR 67 | Ht 66.0 in | Wt 147.0 lb

## 2017-10-01 DIAGNOSIS — N390 Urinary tract infection, site not specified: Secondary | ICD-10-CM

## 2017-10-01 LAB — URINALYSIS, COMPLETE
Bilirubin, UA: NEGATIVE
Glucose, UA: NEGATIVE
KETONES UA: NEGATIVE
Nitrite, UA: POSITIVE — AB
PH UA: 6.5 (ref 5.0–7.5)
Protein, UA: NEGATIVE
SPEC GRAV UA: 1.01 (ref 1.005–1.030)
Urobilinogen, Ur: 0.2 mg/dL (ref 0.2–1.0)

## 2017-10-01 LAB — MICROSCOPIC EXAMINATION
Epithelial Cells (non renal): NONE SEEN /HPF (ref 0–10)
WBC, UA: >30 /HPF — ABNORMAL HIGH (ref 0–5)

## 2017-10-01 MED ORDER — NITROFURANTOIN MONOHYD MACRO 100 MG PO CAPS: 100.0000 mg | ORAL_CAPSULE | Freq: Two times a day (BID) | ORAL | 0 refills | Status: DC

## 2017-10-01 NOTE — Addendum Note (Signed)
Addended by: Tommy Rainwater on: 10/01/2017 02:30 PM   Modules accepted: Orders

## 2017-10-01 NOTE — Progress Notes (Signed)
Patient presents today with urinary frequency and dysuria. Her symptoms started 2 days ago. She has not been on ABX or had any Urological surgeries in the past month. She does take Doxycycline after intercourse to help  prevent infections. A Urine was collected for UA, UCX

## 2017-10-01 NOTE — Addendum Note (Signed)
Addended by: Kyra Manges on: 10/01/2017 09:26 AM   Modules accepted: Level of Service

## 2017-10-01 NOTE — Progress Notes (Signed)
Patient notified per Dr. Erlene Quan going to start on abx and call with culture results. abx sent to pharm

## 2017-10-04 ENCOUNTER — Telehealth: Payer: Self-pay

## 2017-10-04 LAB — CULTURE, URINE COMPREHENSIVE

## 2017-10-04 NOTE — Telephone Encounter (Signed)
Spoke with pt in reference to +ucx and abx. Pt voiced understanding.  

## 2017-10-04 NOTE — Telephone Encounter (Signed)
-----   Message from Hollice Espy, MD sent at 10/04/2017  8:18 AM EDT ----- +UCx.  Abx previously prescribed are appropriate.   Hollice Espy, MD

## 2018-03-12 DIAGNOSIS — L409 Psoriasis, unspecified: Secondary | ICD-10-CM | POA: Insufficient documentation

## 2018-05-06 ENCOUNTER — Telehealth: Payer: Self-pay | Admitting: Urology

## 2018-05-06 NOTE — Telephone Encounter (Signed)
Pt needs refill of Doxycycline and would like to stop by tomorrow and get her urine tested.  She has strong urine smell, frequent urination, not a lot, small amount, discomfort.

## 2018-05-07 ENCOUNTER — Ambulatory Visit (INDEPENDENT_AMBULATORY_CARE_PROVIDER_SITE_OTHER): Payer: Medicare Other

## 2018-05-07 VITALS — BP 120/65 | HR 73

## 2018-05-07 DIAGNOSIS — N390 Urinary tract infection, site not specified: Secondary | ICD-10-CM | POA: Diagnosis not present

## 2018-05-07 LAB — URINALYSIS, COMPLETE
Bilirubin, UA: NEGATIVE
Glucose, UA: NEGATIVE
Ketones, UA: NEGATIVE
Nitrite, UA: POSITIVE — AB
PH UA: 5.5 (ref 5.0–7.5)
PROTEIN UA: NEGATIVE
Specific Gravity, UA: 1.015 (ref 1.005–1.030)
Urobilinogen, Ur: 0.2 mg/dL (ref 0.2–1.0)

## 2018-05-07 LAB — MICROSCOPIC EXAMINATION: Epithelial Cells (non renal): NONE SEEN /hpf (ref 0–10)

## 2018-05-07 MED ORDER — SULFAMETHOXAZOLE-TRIMETHOPRIM 800-160 MG PO TABS: 1.0000 | ORAL_TABLET | Freq: Two times a day (BID) | ORAL | 0 refills | Status: DC

## 2018-05-07 MED ORDER — DOXYCYCLINE HYCLATE 100 MG PO CAPS: ORAL_CAPSULE | ORAL | 3 refills | Status: DC

## 2018-05-07 NOTE — Progress Notes (Signed)
Patient presents today with urinary frequency and dysuria. Patient states her symptoms began 3 days ago. A urine was collected for UA, UCX. Patient states she has not been on ABX or had any Urological surgeries in the last 30 days.   Per Dr. Bernardo Heater patient was started on Bactrim DS and will call with culture results. Patient has an allergy listed for Sulfa in chart but states she cannot remember reaction and would like to try. She will call if a reaction occurs.

## 2018-05-07 NOTE — Telephone Encounter (Signed)
Patient was seen to day in office. Medication refills sent to pharmacy.

## 2018-05-10 ENCOUNTER — Telehealth: Payer: Self-pay | Admitting: Urology

## 2018-05-10 LAB — CULTURE, URINE COMPREHENSIVE

## 2018-05-10 NOTE — Telephone Encounter (Signed)
Called pt, no answer. LM for pt informing her of preliminary urine cx being positive for e.Coli. Advised pt that this bacteria should be susceptible to the bactrim that was prescribed. Advised pt to continue taking taking Bactrim, push fluids, and take AZO. Call back for questions or concerns.

## 2018-05-10 NOTE — Telephone Encounter (Signed)
Pt called asking about results from her culture.  She is still having symptoms.  Please give pt a call.

## 2018-07-01 ENCOUNTER — Other Ambulatory Visit: Payer: Self-pay | Admitting: Family Medicine

## 2018-07-01 DIAGNOSIS — Z1231 Encounter for screening mammogram for malignant neoplasm of breast: Secondary | ICD-10-CM

## 2018-07-11 ENCOUNTER — Ambulatory Visit
Admission: RE | Admit: 2018-07-11 | Discharge: 2018-07-11 | Disposition: A | Discharge status: Home / Self Care | Payer: Medicare Other | Source: Ambulatory Visit | Attending: Family Medicine | Admitting: Family Medicine

## 2018-07-11 ENCOUNTER — Encounter (INDEPENDENT_AMBULATORY_CARE_PROVIDER_SITE_OTHER): Payer: Self-pay

## 2018-07-11 DIAGNOSIS — Z1231 Encounter for screening mammogram for malignant neoplasm of breast: Secondary | ICD-10-CM | POA: Diagnosis present

## 2018-07-16 IMAGING — MG MM DIGITAL SCREENING BILAT W/ TOMO W/ CAD
8 of 12 series · 8 of 28 positions shown · non-contrast
Comparison: Previous exam(s).

CLINICAL DATA: Screening.

EXAM:
2D DIGITAL SCREENING BILATERAL MAMMOGRAM WITH 3D TOMO WITH CAD

[L CC]
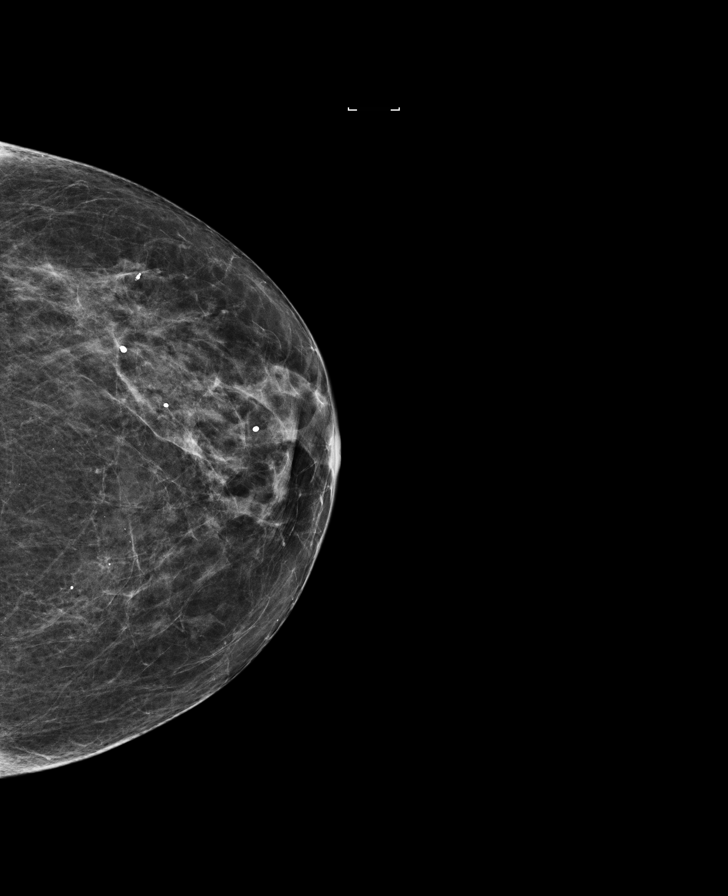

[R MLO]
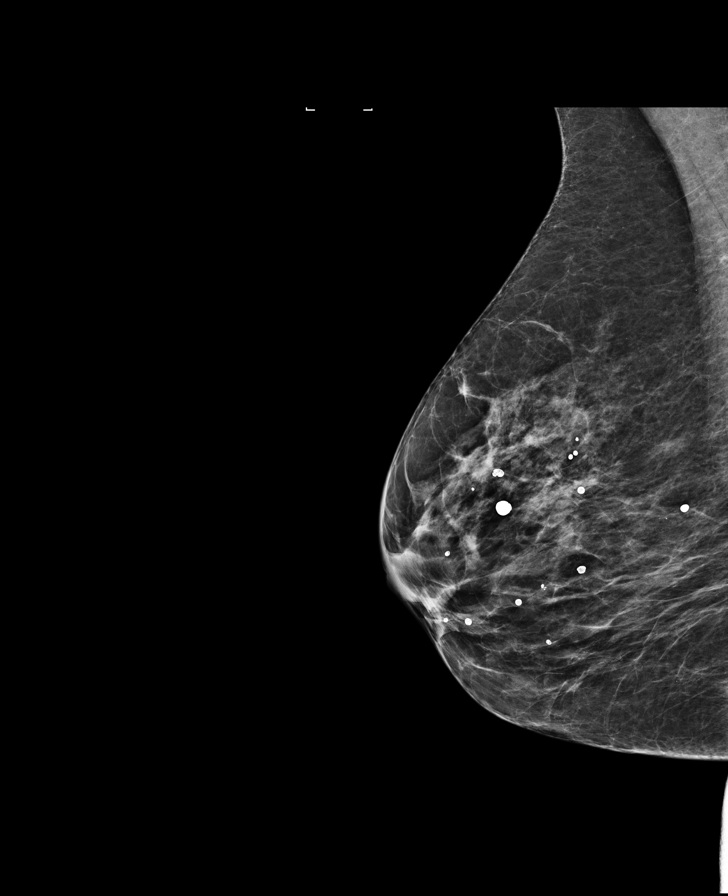

[L CC synth-2D]
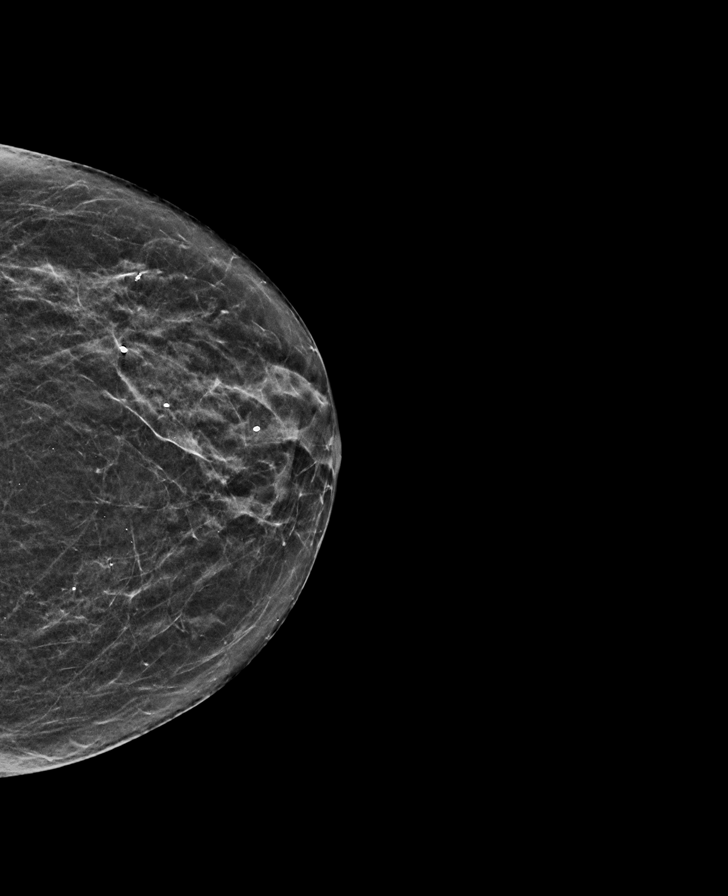

[L MLO synth-2D]
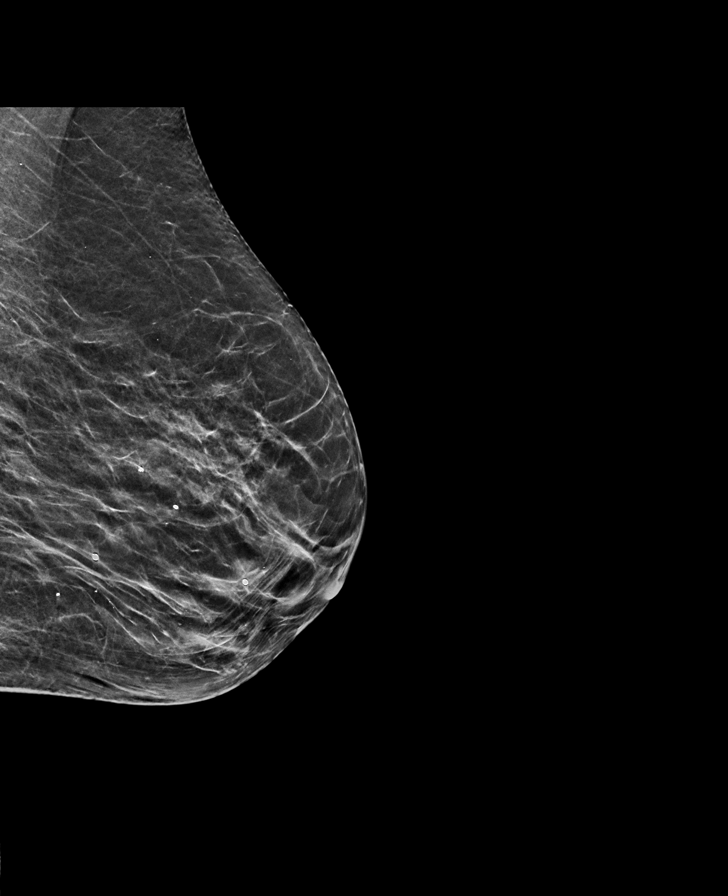

[R MLO synth-2D]
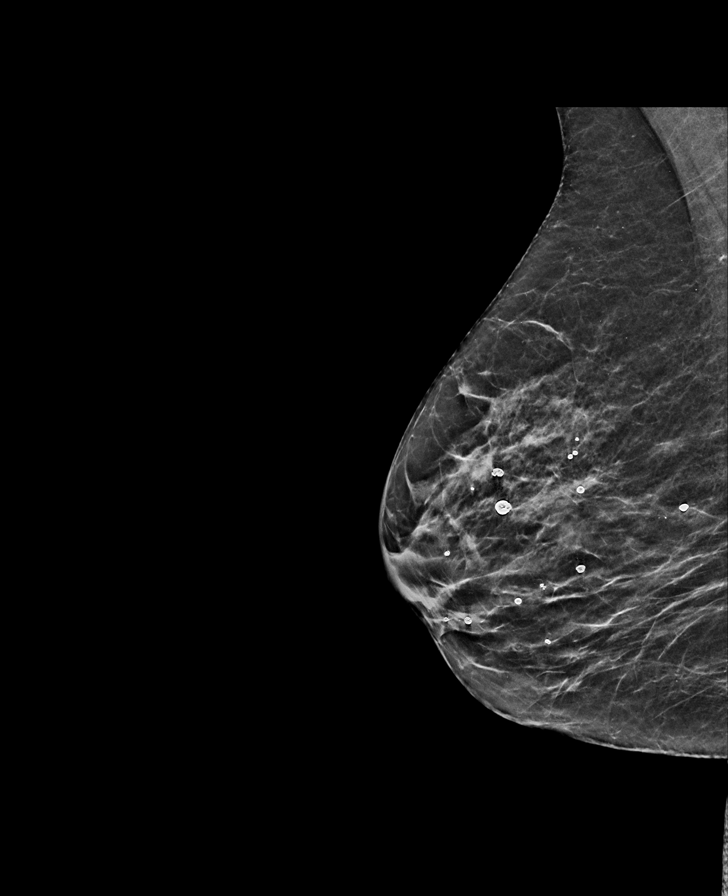

[R CC]
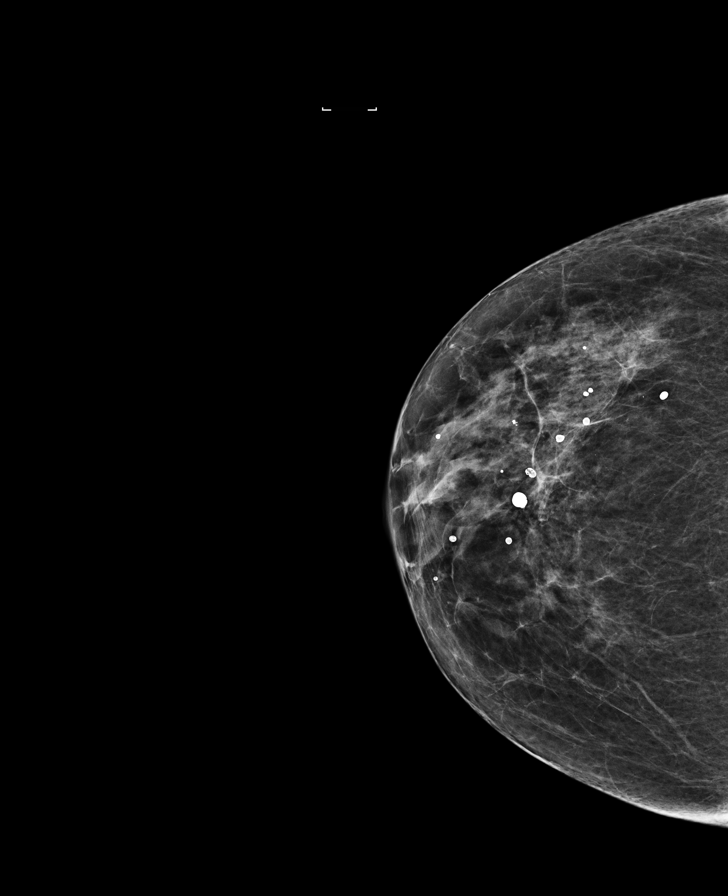

[L MLO]
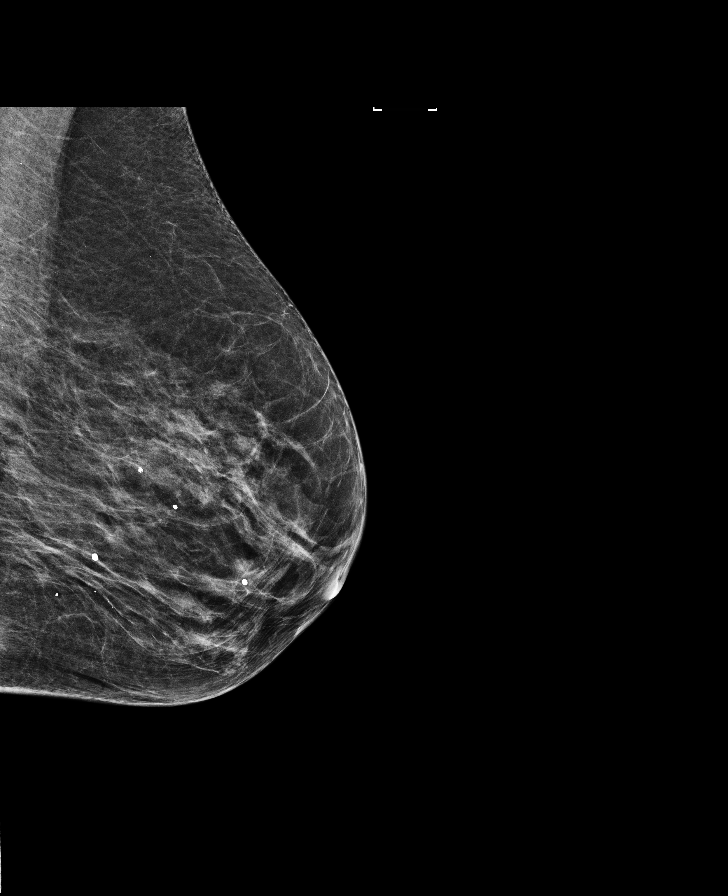

[R CC synth-2D]
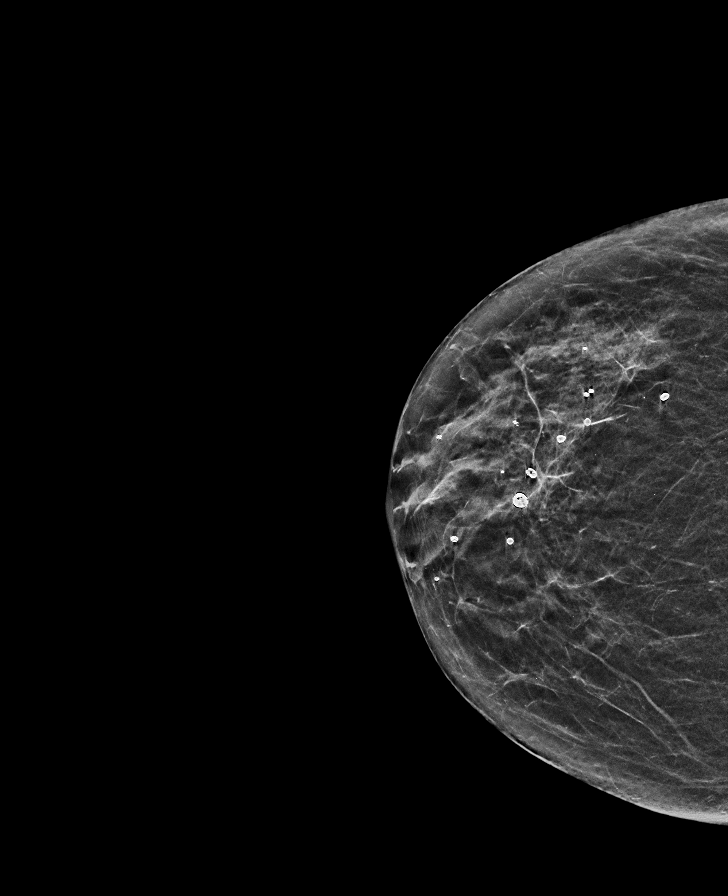

[8 of 28 positions shown; findings below may reference images not displayed]

ACR Breast Density Category b: There are scattered areas of
fibroglandular density.
FINDINGS: There are no findings suspicious for malignancy. Images were
processed with CAD.
IMPRESSION: No mammographic evidence of malignancy. A result letter of this
screening mammogram will be mailed directly to the patient.

RECOMMENDATION:
Screening mammogram in one year. (Code:[id number]-P-ZS0)

BI-RADS CATEGORY  1: Negative.

## 2018-09-02 ENCOUNTER — Ambulatory Visit: Payer: Medicare Other | Admitting: Urology

## 2018-09-05 ENCOUNTER — Other Ambulatory Visit: Payer: Self-pay

## 2018-09-05 ENCOUNTER — Encounter: Payer: Self-pay | Admitting: Urology

## 2018-09-05 ENCOUNTER — Ambulatory Visit (INDEPENDENT_AMBULATORY_CARE_PROVIDER_SITE_OTHER): Payer: Medicare Other | Admitting: Urology

## 2018-09-05 VITALS — BP 119/61 | HR 69 | Ht 66.0 in | Wt 147.8 lb

## 2018-09-05 DIAGNOSIS — N390 Urinary tract infection, site not specified: Secondary | ICD-10-CM | POA: Diagnosis not present

## 2018-09-05 DIAGNOSIS — E7800 Pure hypercholesterolemia, unspecified: Secondary | ICD-10-CM | POA: Insufficient documentation

## 2018-09-05 DIAGNOSIS — R3129 Other microscopic hematuria: Secondary | ICD-10-CM | POA: Diagnosis not present

## 2018-09-05 DIAGNOSIS — K2270 Barrett's esophagus without dysplasia: Secondary | ICD-10-CM | POA: Insufficient documentation

## 2018-09-05 DIAGNOSIS — E559 Vitamin D deficiency, unspecified: Secondary | ICD-10-CM | POA: Insufficient documentation

## 2018-09-05 DIAGNOSIS — I10 Essential (primary) hypertension: Secondary | ICD-10-CM | POA: Insufficient documentation

## 2018-09-05 DIAGNOSIS — M810 Age-related osteoporosis without current pathological fracture: Secondary | ICD-10-CM | POA: Insufficient documentation

## 2018-09-05 DIAGNOSIS — Z85528 Personal history of other malignant neoplasm of kidney: Secondary | ICD-10-CM | POA: Diagnosis not present

## 2018-09-05 LAB — MICROSCOPIC EXAMINATION
BACTERIA UA: NONE SEEN
WBC, UA: NONE SEEN /hpf (ref 0–5)

## 2018-09-05 LAB — URINALYSIS, COMPLETE
Bilirubin, UA: NEGATIVE
Glucose, UA: NEGATIVE
Ketones, UA: NEGATIVE
Leukocytes, UA: NEGATIVE
Nitrite, UA: NEGATIVE
Protein, UA: NEGATIVE
Specific Gravity, UA: 1.02 (ref 1.005–1.030)
Urobilinogen, Ur: 0.2 mg/dL (ref 0.2–1.0)
pH, UA: 7 (ref 5.0–7.5)

## 2018-09-05 NOTE — Progress Notes (Signed)
09/05/2018 2:36 PM   Carolyn Frye 05-25-1941 [id number]  Referring provider: Dion Body, MD [place] [facility] [place], [place] [address]  Chief Complaint  Patient presents with  . Recurrent UTI  . Hematuria   Urologic history: 1.  Recurrent lower tract UTI  -On postcoital prophylaxis  2.  History renal cell carcinoma  -Status post hand-assisted laparoscopic right nephrectomy 2006  3.  Asymptomatic microhematuria  -Long history   HPI: 78 year old female presents for annual follow-up.  She has done well on postcoital prophylaxis.  She had 2 UTIs last year but states she did not utilize the prophylaxis after intercourse and ended up with an infection.  She has no complaints today.   PMH: Past Medical History:  Diagnosis Date  . Arthritis    hands, feet  . Cancer [facility]) 2007   kidney  . Cancer ([place]) 2014   melanoma  . Diverticulosis   . GERD (gastroesophageal reflux disease)   . Hypercholesteremia   . Hypertension   . MRSA (methicillin resistant Staphylococcus aureus) 03/03/2009   foot wound - treated  . Osteoporosis   . Seasonal allergies   . Wears contact lenses   . Wears dentures    partial bottom    Surgical History: Past Surgical History:  Procedure Laterality Date  . ABDOMINAL HYSTERECTOMY    . BREAST BIOPSY Left 1985   neg  . BREAST CYST ASPIRATION Left 1993   lt fna-neg  . CATARACT EXTRACTION W/PHACO Left 02/03/2015   Procedure: CATARACT EXTRACTION PHACO AND INTRAOCULAR LENS PLACEMENT (IOC);  Surgeon: Leandrew Koyanagi, MD;  Location: [facility];  Service: Ophthalmology;  Laterality: Left;  . CATARACT EXTRACTION W/PHACO Right 03/10/2015   Procedure: CATARACT EXTRACTION PHACO AND INTRAOCULAR LENS PLACEMENT (IOC);  Surgeon: Leandrew Koyanagi, MD;  Location: [facility];  Service: Ophthalmology;  Laterality: Right;  . CHOLECYSTECTOMY    . COLONOSCOPY    . FOOT SURGERY     for MRSA  .  NEPHRECTOMY Right 2007  . SKIN CANCER EXCISION    . VEIN LIGATION Right     Home Medications:  Allergies as of 09/05/2018      Reactions   Atorvastatin    Other reaction(s): Other (See Comments) Joint aches Joint aches   Ciprofloxacin Other (See Comments)   Shoulder pain, tendon tear   Dilaudid [hydromorphone] Other (See Comments)   Severe headache   Macrobid [nitrofurantoin] Other (See Comments)   Red, burning eyes   Sulfa Antibiotics Other (See Comments)   Hydralazine Palpitations   Levaquin [levofloxacin] Anxiety      Medication List       Accurate as of September 05, 2018  2:36 PM. Always use your most recent med list.        acetaminophen 500 MG tablet Commonly known as:  TYLENOL Take by mouth.   amLODipine 10 MG tablet Commonly known as:  NORVASC Take 10 mg by mouth daily. AM   betamethasone dipropionate 0.05 % ointment Commonly known as:  DIPROLENE   calcipotriene 0.005 % cream Commonly known as:  DOVONOX Apply topically 2 (two) times daily.   clobetasol ointment 0.05 % Commonly known as:  TEMOVATE Apply 1 application topically 2 (two) times daily.   doxycycline 100 MG capsule Commonly known as:  VIBRAMYCIN 100 mg po after intercourse   hydrochlorothiazide 12.5 MG tablet Commonly known as:  HYDRODIURIL Take 12.5 mg by mouth daily. AM   labetalol 100 MG tablet Commonly known as:  NORMODYNE  Take 100 mg by mouth 2 (two) times daily.   loratadine 10 MG tablet Commonly known as:  CLARITIN Take 10 mg by mouth daily.   meloxicam 15 MG tablet Commonly known as:  MOBIC   omeprazole 20 MG capsule Commonly known as:  PRILOSEC Take 20 mg by mouth daily. AM   rosuvastatin 10 MG tablet Commonly known as:  CRESTOR Take 10 mg by mouth daily. AM       Allergies:  Allergies  Allergen Reactions  . Atorvastatin     Other reaction(s): Other (See Comments) Joint aches Joint aches  . Ciprofloxacin Other (See Comments)    Shoulder pain, tendon tear  .  Dilaudid [Hydromorphone] Other (See Comments)    Severe headache  . Macrobid [Nitrofurantoin] Other (See Comments)    Red, burning eyes  . Sulfa Antibiotics Other (See Comments)  . Hydralazine Palpitations  . Levaquin [Levofloxacin] Anxiety    Family History: Family History  Problem Relation Age of Onset  . Breast cancer Neg Hx   . Bladder Cancer Neg Hx   . Kidney cancer Neg Hx     Social History:  reports that she has never smoked. She has never used smokeless tobacco. She reports that she does not drink alcohol or use drugs.  ROS: UROLOGY Frequent Urination?: No Hard to postpone urination?: No Burning/pain with urination?: No Get up at night to urinate?: Yes Leakage of urine?: No Urine stream starts and stops?: No Trouble starting stream?: No Do you have to strain to urinate?: No Blood in urine?: No Urinary tract infection?: No Sexually transmitted disease?: No Injury to kidneys or bladder?: No Painful intercourse?: No Weak stream?: No Currently pregnant?: No Vaginal bleeding?: No Last menstrual period?: n  Gastrointestinal Nausea?: No Vomiting?: No Indigestion/heartburn?: No Diarrhea?: No Constipation?: No  Constitutional Fever: No Night sweats?: No Weight loss?: No Fatigue?: No  Skin Skin rash/lesions?: No Itching?: No  Eyes Blurred vision?: No Double vision?: No  Ears/Nose/Throat Sore throat?: No Sinus problems?: Yes  Hematologic/Lymphatic Swollen glands?: No Easy bruising?: No  Cardiovascular Leg swelling?: No Chest pain?: No  Respiratory Cough?: No Shortness of breath?: No  Endocrine Excessive thirst?: No  Musculoskeletal Back pain?: No Joint pain?: Yes  Neurological Headaches?: No Dizziness?: No  Psychologic Depression?: No Anxiety?: No  Physical Exam: BP 119/61 (BP Location: Left Arm, Patient Position: Sitting, Cuff Size: Normal)   Pulse 69   Ht 5\' 6"  (1.676 m)   Wt 147 lb 12.8 oz (67 kg)   BMI 23.86 kg/m    Constitutional:  Alert and oriented, No acute distress. HEENT: [place] AT, moist mucus membranes.  Trachea midline, no masses. Cardiovascular: No clubbing, cyanosis, or edema. Respiratory: Normal respiratory effort, no increased work of breathing. Lymph: No cervical or inguinal lymphadenopathy. Skin: No rashes, bruises or suspicious lesions. Neurologic: Grossly intact, no focal deficits, moving all 4 extremities. Psychiatric: Normal mood and affect.  Laboratory Data:  Urinalysis Dipstick 1+ blood negative leukocytes Microscopy negative RBC/WBC   Pertinent Imaging: Results for orders placed during the hospital encounter of 06/25/17  Ultrasound renal complete   Narrative CLINICAL DATA:  Recurring urinary tract infections. History of right nephrectomy for malignancy.  EXAM: RENAL / URINARY TRACT ULTRASOUND COMPLETE  COMPARISON:  CT abdomen and pelvis 04/05/2010  FINDINGS: Right Kidney:  Surgically absent.  Left Kidney:  Length: 12.4 cm. Echogenicity within normal limits. No hydronephrosis. 1.4 cm lower pole cystic lesion with internal septations including a 2 mm nodular focus, very similar in size  and overall appearance to the prior CT allowing for differences in modality.  Bladder:  Appears normal for degree of bladder distention.  IMPRESSION: 1. 1.4 cm complex left lower pole renal cyst, not significantly changed in size from a 2011 CT. 2. No hydronephrosis. 3. Prior right nephrectomy.   Electronically Signed   By: Logan Bores M.D.   On: 06/25/2017 09:40     Assessment & Plan:   Currently doing well on postcoital prophylaxis.  Remote history of renal cell carcinoma.  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  Return in about 1 year (around 09/05/2019) for Recheck.  Abbie Sons, [place] [address], [place], [place] [address] [phone]

## 2019-05-19 ENCOUNTER — Other Ambulatory Visit: Payer: Self-pay | Admitting: Urology

## 2019-05-19 DIAGNOSIS — N390 Urinary tract infection, site not specified: Secondary | ICD-10-CM

## 2019-07-29 ENCOUNTER — Other Ambulatory Visit: Payer: Self-pay | Admitting: Family Medicine

## 2019-07-29 DIAGNOSIS — Z1231 Encounter for screening mammogram for malignant neoplasm of breast: Secondary | ICD-10-CM

## 2019-08-06 ENCOUNTER — Ambulatory Visit
Admission: RE | Admit: 2019-08-06 | Discharge: 2019-08-06 | Disposition: A | Discharge status: Home / Self Care | Payer: Medicare Other | Source: Ambulatory Visit | Attending: Family Medicine | Admitting: Family Medicine

## 2019-08-06 ENCOUNTER — Other Ambulatory Visit: Payer: Self-pay

## 2019-08-06 DIAGNOSIS — Z1231 Encounter for screening mammogram for malignant neoplasm of breast: Secondary | ICD-10-CM | POA: Diagnosis not present

## 2019-09-05 ENCOUNTER — Ambulatory Visit: Payer: Medicare Other | Admitting: Urology

## 2019-09-28 DIAGNOSIS — M1612 Unilateral primary osteoarthritis, left hip: Secondary | ICD-10-CM | POA: Insufficient documentation

## 2019-10-09 ENCOUNTER — Encounter: Payer: Self-pay | Admitting: Urology

## 2019-10-09 ENCOUNTER — Other Ambulatory Visit: Payer: Self-pay

## 2019-10-09 ENCOUNTER — Ambulatory Visit (INDEPENDENT_AMBULATORY_CARE_PROVIDER_SITE_OTHER): Payer: Medicare Other | Admitting: Urology

## 2019-10-09 ENCOUNTER — Other Ambulatory Visit: Payer: Self-pay | Admitting: Urology

## 2019-10-09 VITALS — BP 159/90 | HR 70 | Ht 66.0 in | Wt 143.0 lb

## 2019-10-09 DIAGNOSIS — N281 Cyst of kidney, acquired: Secondary | ICD-10-CM

## 2019-10-09 DIAGNOSIS — Z85528 Personal history of other malignant neoplasm of kidney: Secondary | ICD-10-CM | POA: Diagnosis not present

## 2019-10-09 DIAGNOSIS — R3129 Other microscopic hematuria: Secondary | ICD-10-CM

## 2019-10-09 LAB — URINALYSIS, COMPLETE
Bilirubin, UA: NEGATIVE
Glucose, UA: NEGATIVE
Ketones, UA: NEGATIVE
Leukocytes,UA: NEGATIVE
Nitrite, UA: NEGATIVE
Protein,UA: NEGATIVE
Specific Gravity, UA: 1.02 (ref 1.005–1.030)
Urobilinogen, Ur: 0.2 mg/dL (ref 0.2–1.0)
pH, UA: 7 (ref 5.0–7.5)

## 2019-10-09 LAB — MICROSCOPIC EXAMINATION: Bacteria, UA: NONE SEEN

## 2019-10-09 NOTE — Progress Notes (Signed)
10/09/2019 9:28 AM   [name] 1940-10-09 [id number]  Referring provider: Dion Body, MD [place] [facility] [place],  [place] [address]  Chief Complaint  Patient presents with  . Other    Urologic history: 1.  Recurrent lower tract UTI             -Previously on postcoital prophylaxis  2.  History renal cell carcinoma             -Status post hand-assisted laparoscopic right nephrectomy 2006  3.  Asymptomatic microhematuria             -Long history   HPI: 79 y.o. female presents for annual follow-up.  Since her visit last year she has done well.  She was having itching with doxycycline and discontinued her postcoital prophylaxis however has remained infection free.  Denies dysuria, gross hematuria.  No flank, abdominal or pelvic pain.  She has a Bosniak 2 renal cyst and last ultrasound was January 2019.   PMH: Past Medical History:  Diagnosis Date  . Arthritis    hands, feet  . Cancer [facility]) 2007   kidney  . Cancer ([place]) 2014   melanoma  . Diverticulosis   . GERD (gastroesophageal reflux disease)   . Hypercholesteremia   . Hypertension   . MRSA (methicillin resistant Staphylococcus aureus) 03/03/2009   foot wound - treated  . Osteoporosis   . Seasonal allergies   . Wears contact lenses   . Wears dentures    partial bottom    Surgical History: Past Surgical History:  Procedure Laterality Date  . ABDOMINAL HYSTERECTOMY    . BREAST BIOPSY Left 1985   neg  . BREAST CYST ASPIRATION Left 1993   lt fna-neg  . CATARACT EXTRACTION W/PHACO Left 02/03/2015   Procedure: CATARACT EXTRACTION PHACO AND INTRAOCULAR LENS PLACEMENT (IOC);  Surgeon: Leandrew Koyanagi, MD;  Location: [place];  Service: Ophthalmology;  Laterality: Left;  . CATARACT EXTRACTION W/PHACO Right 03/10/2015   Procedure: CATARACT EXTRACTION PHACO AND INTRAOCULAR LENS PLACEMENT (IOC);  Surgeon: Leandrew Koyanagi, MD;  Location: [place];  Service: Ophthalmology;  Laterality: Right;  . CHOLECYSTECTOMY    . COLONOSCOPY    . FOOT SURGERY     for MRSA  . NEPHRECTOMY Right 2007  . SKIN CANCER EXCISION    . VEIN LIGATION Right     Home Medications:  Allergies as of 10/09/2019      Reactions   Atorvastatin    Other reaction(s): Other (See Comments) Joint aches Joint aches   Ciprofloxacin Other (See Comments)   Shoulder pain, tendon tear   Dilaudid [hydromorphone] Other (See Comments)   Severe headache   Macrobid [nitrofurantoin] Other (See Comments)   Red, burning eyes   Sulfa Antibiotics Other (See Comments)   Hydralazine Palpitations   Levaquin [levofloxacin] Anxiety      Medication List       Accurate as of October 09, 2019  9:28 AM. If you have any questions, ask your nurse or doctor.        STOP taking these medications   betamethasone dipropionate 0.05 % ointment Commonly known as: DIPROLENE Stopped by: Abbie Sons, MD   calcipotriene 0.005 % cream Commonly known as: DOVONOX Stopped by: Abbie Sons, MD   doxycycline 100 MG capsule Commonly known as: VIBRAMYCIN Stopped by: Abbie Sons, MD   meloxicam 15 MG tablet Commonly known as: MOBIC Stopped by: Abbie Sons, MD  TAKE these medications   acetaminophen 500 MG tablet Commonly known as: TYLENOL Take by mouth.   amLODipine 10 MG tablet Commonly known as: NORVASC Take 10 mg by mouth daily. AM   clobetasol ointment 0.05 % Commonly known as: TEMOVATE Apply 1 application topically 2 (two) times daily.   Cosentyx Sensoready Pen 150 MG/ML [place] Generic drug: Secukinumab Inject the contents of 2 pens (300mg ) under the skin once weekly for 5 weeks, then inject the contents of 2 pens (300mg ) once every 4 weeks.   hydrochlorothiazide 12.5 MG tablet Commonly known as: HYDRODIURIL Take 12.5 mg by mouth daily. AM   labetalol 100 MG tablet Commonly known as: NORMODYNE Take 100 mg by mouth 2 (two) times daily.     loratadine 10 MG tablet Commonly known as: CLARITIN Take 10 mg by mouth daily.   omeprazole 20 MG capsule Commonly known as: PRILOSEC Take 20 mg by mouth daily. AM   rosuvastatin 10 MG tablet Commonly known as: CRESTOR Take 10 mg by mouth daily. AM       Allergies:  Allergies  Allergen Reactions  . Atorvastatin     Other reaction(s): Other (See Comments) Joint aches Joint aches  . Ciprofloxacin Other (See Comments)    Shoulder pain, tendon tear  . Dilaudid [Hydromorphone] Other (See Comments)    Severe headache  . Macrobid [Nitrofurantoin] Other (See Comments)    Red, burning eyes  . Sulfa Antibiotics Other (See Comments)  . Hydralazine Palpitations  . Levaquin [Levofloxacin] Anxiety    Family History: Family History  Problem Relation Age of Onset  . Breast cancer Neg Hx   . Bladder Cancer Neg Hx   . Kidney cancer Neg Hx     Social History:  reports that she has never smoked. She has never used smokeless tobacco. She reports that she does not drink alcohol or use drugs.   Physical Exam: BP (!) 159/90   Pulse 70   Ht 5\' 6"  (1.676 m)   Wt 143 lb (64.9 kg)   BMI 23.08 kg/m   Constitutional:  Alert and oriented, No acute distress. HEENT: [place] AT, moist mucus membranes.  Trachea midline, no masses. Cardiovascular: No clubbing, cyanosis, or edema. Respiratory: Normal respiratory effort, no increased work of breathing.    Assessment & Plan:    - History recurrent UTI Currently doing well.  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  Will call with results  -Asymptomatic microhematuria Stable.  Urinalysis shows 3-10 RBCs  Continue annual follow-up   Abbie Sons, MD  [place] [address], [place], [place] [address] [phone]

## 2019-10-21 ENCOUNTER — Other Ambulatory Visit: Payer: Self-pay

## 2019-10-21 ENCOUNTER — Ambulatory Visit
Admission: RE | Admit: 2019-10-21 | Discharge: 2019-10-21 | Disposition: A | Discharge status: Home / Self Care | Payer: Medicare Other | Source: Ambulatory Visit | Attending: Urology | Admitting: Urology

## 2019-10-21 DIAGNOSIS — N281 Cyst of kidney, acquired: Secondary | ICD-10-CM | POA: Diagnosis present

## 2019-10-22 ENCOUNTER — Telehealth: Payer: Self-pay | Admitting: *Deleted

## 2019-10-22 NOTE — Telephone Encounter (Signed)
-----   Message from Abbie Sons, MD sent at 10/21/2019  7:35 PM EDT ----- Renal ultrasound showed a stable renal cyst which was slightly smaller than 2019.

## 2019-10-22 NOTE — Telephone Encounter (Signed)
Notified patient as instructed, patient pleased. Discussed follow-up appointments, patient agrees  

## 2019-11-09 NOTE — Discharge Instructions (Signed)
Instructions after Total Hip Replacement     Carolyn Frye P. Usha Slager, Jr., M.D.     Dept. of Orthopaedics & Sports Medicine  [facility]  [address]  [place], [place]  [address]  Phone: [phone]   Fax: [phone]    DIET: . Drink plenty of non-alcoholic fluids. . Resume your normal diet. Include foods high in fiber.  ACTIVITY:  . You may use crutches or a walker with weight-bearing as tolerated, unless instructed otherwise. . You may be weaned off of the walker or crutches by your Physical Therapist.  . Do NOT reach below the level of your knees or cross your legs until allowed.    . Continue doing gentle exercises. Exercising will reduce the pain and swelling, increase motion, and prevent muscle weakness.   . Please continue to use the TED compression stockings for 6 weeks. You may remove the stockings at night, but should reapply them in the morning. . Do not drive or operate any equipment until instructed.  WOUND CARE:  . Continue to use ice packs periodically to reduce pain and swelling. . Keep the incision clean and dry. . You may bathe or shower after the staples are removed at the first office visit following surgery.  MEDICATIONS: . You may resume your regular medications. . Please take the pain medication as prescribed on the medication. . Do not take pain medication on an empty stomach. . You have been given a prescription for a blood thinner to prevent blood clots. Please take the medication as instructed. (NOTE: After completing a 2 week course of Lovenox, take one Enteric-coated aspirin once a day.) . Pain medications and iron supplements can cause constipation. Use a stool softener (Senokot or Colace) on a daily basis and a laxative (dulcolax or miralax) as needed. . Do not drive or drink alcoholic beverages when taking pain medications.  CALL THE OFFICE FOR: . Temperature above 101 degrees . Excessive bleeding or drainage on the dressing. . Excessive  swelling, coldness, or paleness of the toes. . Persistent nausea and vomiting.  FOLLOW-UP:  . You should have an appointment to return to the office in 6 weeks after surgery. . Arrangements have been made for continuation of Physical Therapy (either home therapy or outpatient therapy).     [facility] Department Directory         [email or web address]       [email or web address]          Cardiology  Appointments: [place] - [phone] [place] - [phone]  Endocrinology  Appointments: [place] - [phone] [place] - [phone]  Gastroenterology  Appointments: [place] - [phone] [place] - [phone]        General Surgery   Appointments: [place] - [phone]  Internal Medicine/Family Medicine  Appointments: [place] - [phone] [place] - [phone] [place] - [phone]  Metabolic and Weigh Loss Surgery  Appointments: [place] - [phone]        Neurology  Appointments: [place] - [phone] [place] - [phone]  Neurosurgery  Appointments: [place] - [phone]  Obstetrics & Gynecology  Appointments: [place] - [phone] [place] - [phone]        Pediatrics  Appointments: [place] - [phone] [place] - [phone]  Physiatry  Appointments: [place] -[phone]  Physical Therapy  Appointments: [place] - [phone] [place] - [phone]        Podiatry  Appointments: [place] - [phone] [place] - [phone]  Pulmonology  Appointments: [place] - [phone]  Rheumatology  Appointments: [place] - [phone]         Location: [facility]  [address] [place], [place]  [address]  [place] Location: [facility] [address] [place], [place]  [address]  [place] Location: [facility] [address] [place], [place]  [address]    

## 2019-11-13 ENCOUNTER — Other Ambulatory Visit: Payer: Self-pay

## 2019-11-13 ENCOUNTER — Encounter
Admission: RE | Admit: 2019-11-13 | Discharge: 2019-11-13 | Disposition: A | Discharge status: Home / Self Care | Payer: Medicare Other | Source: Ambulatory Visit | Attending: Orthopedic Surgery | Admitting: Orthopedic Surgery

## 2019-11-13 DIAGNOSIS — Z01812 Encounter for preprocedural laboratory examination: Secondary | ICD-10-CM | POA: Insufficient documentation

## 2019-11-13 HISTORY — DX: Chronic kidney disease, unspecified: N18.9

## 2019-11-13 HISTORY — DX: Cardiac arrhythmia, unspecified: I49.9

## 2019-11-13 HISTORY — DX: Personal history of urinary calculi: Z87.442

## 2019-11-13 HISTORY — DX: Cellulitis, unspecified: L03.90

## 2019-11-13 NOTE — Patient Instructions (Addendum)
Your procedure is scheduled on: Wed. 6/9 Report to Day Surgery. To find out your arrival time please call [phone] between 1PM - 3PM on Tues 6/8.  Remember: Instructions that are not followed completely may result in serious medical risk,  up to and including death, or upon the discretion of your surgeon and anesthesiologist your  surgery may need to be rescheduled.     _X__ 1. Do not eat food after midnight the night before your procedure.                 No gum chewing or hard candies. You may drink clear liquids up to 2 hours                 before you are scheduled to arrive for your surgery- DO not drink clear                 liquids within 2 hours of the start of your surgery.                 Clear Liquids include:  water, apple juice without pulp, clear Gatorade, G2 or                  Gatorade Zero (avoid Red/Purple/Blue), Black Coffee or Tea (Do not add                 anything to coffee or tea). ___x__2.   Complete the carbohydrate drink provided to you, 2 hours before arrival.  __X__2.  On the morning of surgery brush your teeth with toothpaste and water, you                may rinse your mouth with mouthwash if you wish.  Do not swallow any toothpaste of mouthwash.     _X__ 3.  No Alcohol for 24 hours before or after surgery.   ___ 4.  Do Not Smoke or use e-cigarettes For 24 Hours Prior to Your Surgery.                 Do not use any chewable tobacco products for at least 6 hours prior to                 Surgery.  ___  5.  Do not use any recreational drugs (marijuana, cocaine, heroin, ecstacy, MDMA or other)                For at least one week prior to your surgery.  Combination of these drugs with anesthesia                May have life threatening results.  ____  6.  Bring all medications with you on the day of surgery if instructed.   _x___  7.  Notify your doctor if there is any change in your medical condition      (cold, fever,  infections).     Do not wear jewelry, make-up, hairpins, clips or nail polish. Do not wear lotions, powders, or perfumes. You may wear deodorant. Do not shave 48 hours prior to surgery Do not bring valuables to the hospital.    [facility] is not responsible for any belongings or valuables.  Contacts, dentures or bridgework may not be worn into surgery. Leave your suitcase in the car. After surgery it may be brought to your room. For patients admitted to the hospital, discharge time is determined by your treatment team.   Patients discharged the  day of surgery will not be allowed to drive home.   Make arrangements for someone to be with you for the first 24 hours of your Same Day Discharge.    Please read over the following fact sheets that you were given: Incentive Spirometer   ___x_ Take these medicines the morning of surgery with A SIP OF WATER:    1. amLODipine (NORVASC) 10 MG tablet  2. labetalol (NORMODYNE) 100 MG tablet  3. omeprazole (PRILOSEC) 20 MG capsule   Dose the night before and the morning of surgery  4.rosuvastatin (CRESTOR) 10 MG tablet  5.  6.  ____ Fleet Enema (as directed)   __x__ Use CHG Soap (or wipes) as directed  ____ Use Benzoyl Peroxide Gel as instructed  ____ Use inhalers on the day of surgery  ____ Stop metformin 2 days prior to surgery    ____ Take 1/2 of usual insulin dose the night before surgery. No insulin the morning          of surgery.   ____ Stop Coumadin/Plavix/aspirin   _x___ Stop Anti-inflammatories no ibuprofen aleve or aspirin after 6/2     May continue Tylenol   ____ Stop supplements until after surgery.    ____ Bring C-Pap to the hospital.

## 2019-11-18 ENCOUNTER — Other Ambulatory Visit: Payer: Self-pay

## 2019-11-18 ENCOUNTER — Encounter
Admission: RE | Admit: 2019-11-18 | Discharge: 2019-11-18 | Disposition: A | Discharge status: Home / Self Care | Payer: Medicare Other | Source: Ambulatory Visit | Attending: Orthopedic Surgery | Admitting: Orthopedic Surgery

## 2019-11-18 DIAGNOSIS — Z01818 Encounter for other preprocedural examination: Secondary | ICD-10-CM | POA: Insufficient documentation

## 2019-11-18 LAB — CBC WITH DIFFERENTIAL/PLATELET
Abs Immature Granulocytes: 0.04 10*3/uL (ref 0.00–0.07)
Basophils Absolute: 0 10*3/uL (ref 0.0–0.1)
Basophils Relative: 1 %
Eosinophils Absolute: 0.1 10*3/uL (ref 0.0–0.5)
Eosinophils Relative: 2 %
HCT: 39 % (ref 36.0–46.0)
Hemoglobin: 12.9 g/dL (ref 12.0–15.0)
Immature Granulocytes: 1 %
Lymphocytes Relative: 20 %
Lymphs Abs: 1.3 10*3/uL (ref 0.7–4.0)
MCH: 27.9 pg (ref 26.0–34.0)
MCHC: 33.1 g/dL (ref 30.0–36.0)
MCV: 84.2 fL (ref 80.0–100.0)
Monocytes Absolute: 0.8 10*3/uL (ref 0.1–1.0)
Monocytes Relative: 12 %
Neutro Abs: 4.1 10*3/uL (ref 1.7–7.7)
Neutrophils Relative %: 64 %
Platelets: 185 10*3/uL (ref 150–400)
RBC: 4.63 MIL/uL (ref 3.87–5.11)
RDW: 14.7 % (ref 11.5–15.5)
WBC: 6.4 10*3/uL (ref 4.0–10.5)
nRBC: 0 % (ref 0.0–0.2)

## 2019-11-18 LAB — URINALYSIS, ROUTINE W REFLEX MICROSCOPIC
Bacteria, UA: NONE SEEN
Bilirubin Urine: NEGATIVE
Glucose, UA: NEGATIVE mg/dL
Ketones, ur: NEGATIVE mg/dL
Leukocytes,Ua: NEGATIVE
Nitrite: NEGATIVE
Protein, ur: NEGATIVE mg/dL
Specific Gravity, Urine: 1.017 (ref 1.005–1.030)
Squamous Epithelial / HPF: NONE SEEN (ref 0–5)
pH: 5 (ref 5.0–8.0)

## 2019-11-18 LAB — COMPREHENSIVE METABOLIC PANEL
ALT: 13 U/L (ref 0–44)
AST: 18 U/L (ref 15–41)
Albumin: 3.9 g/dL (ref 3.5–5.0)
Alkaline Phosphatase: 84 U/L (ref 38–126)
Anion gap: 9 (ref 5–15)
BUN: 19 mg/dL (ref 8–23)
CO2: 26 mmol/L (ref 22–32)
Calcium: 9.1 mg/dL (ref 8.9–10.3)
Chloride: 101 mmol/L (ref 98–111)
Creatinine, Ser: 0.96 mg/dL (ref 0.44–1.00)
GFR calc Af Amer: >60 mL/min (ref >60)
GFR calc non Af Amer: 57 mL/min — ABNORMAL LOW (ref >60)
Glucose, Bld: 99 mg/dL (ref 70–99)
Potassium: 3.5 mmol/L (ref 3.5–5.1)
Sodium: 136 mmol/L (ref 135–145)
Total Bilirubin: 0.6 mg/dL (ref 0.3–1.2)
Total Protein: 7.1 g/dL (ref 6.5–8.1)

## 2019-11-18 LAB — SURGICAL PCR SCREEN
MRSA, PCR: NEGATIVE
Staphylococcus aureus: NEGATIVE

## 2019-11-18 LAB — SEDIMENTATION RATE: Sed Rate: 14 mm/hr (ref 0–30)

## 2019-11-18 LAB — PROTIME-INR
INR: 1.1 (ref 0.8–1.2)
Prothrombin Time: 13.7 seconds (ref 11.4–15.2)

## 2019-11-18 LAB — TYPE AND SCREEN
ABO/RH(D): A POS
Antibody Screen: NEGATIVE

## 2019-11-18 LAB — C-REACTIVE PROTEIN: CRP: 0.6 mg/dL (ref <1.0)

## 2019-11-18 LAB — APTT: aPTT: 32 seconds (ref 24–36)

## 2019-11-18 NOTE — Pre-Procedure Instructions (Addendum)
SECURE CHAT WITH DR Kayleen Memos:  Pt having THR with [facility] on 6-9. Pt has htn. Ekg done today is abnormal. Can you compare Ekg with the one from 2018. MUSE is interpreting the ekg done today as no significant change from the one in 2018. Are we ok to proceed?   The EKG shows no change from 2018 , and no acute findings. The patient is followed by Dr. Nehemiah Massed q year. Although I do not think the patient needs to be cleared, I would fax the EKG over to Dr. Nehemiah Massed for review.

## 2019-11-18 NOTE — Pre-Procedure Instructions (Signed)
CALLED OVER AND SPOKE WITH TIFFANY AT DR HOOTENS OFFICE ABOUT ANESTHESIA WANTING EKG TO BE SENT TO [facility] FOR REVIEW. TIFFANY SAID SHE WOULD RELAY INFO TO [place] IN AM

## 2019-11-19 LAB — URINE CULTURE
Culture: <10000 — AB
Special Requests: NORMAL

## 2019-11-20 NOTE — Pre-Procedure Instructions (Signed)
CARDIAC CLEARANCE ON CHART-LOW RISK 

## 2019-11-20 NOTE — Pre-Procedure Instructions (Signed)
Progress Notes - documented in this encounter Carolyn Dibble, MD - 11/20/2019 3:15 PM EDT Formatting of this note is different from the original. Established Patient Visit   Chief Complaint: Chief Complaint  Patient presents with   Hypertension  Date of Service: 11/20/2019 Date of Birth: 03-Mar-1941 PCP: Dion Body, MD  History of Present Illness: Carolyn Frye is a 79 y.o.female patient  Limb pain The patient has left lower thigh extremity pain with and without physical activity. These symptoms are worsening with increased severity over the last 4 months relieved by nothing in particular. Other associated signs and symptoms include swelling, rubor, disability, instability and tenderness and needs hip surgery Essential Hypertension The patient has been on medication management listed below for essential hypertension. Reported average blood pressure readings recently have shown that the blood pressure is stable. The patient has not had any side effects of these medications at this time. We have discussed treatment goals with the patient for which they understand and agree. This includes medication management, lifestyle changes, and diet management. Currently there is no apparent secondary causes of the hypertension at this time. Mixed Hyperlipidemia The patient has cardiovascular risk factors including High LDL cholesterol and greater than 7.5% 10 year cardiovascular risk score and therefore has been placed on Moderate intensity therapy with rosuvastatin (Crestor) for reduction in LDL and cardiovascular risk for which we have discussed today. Other healthy lifestyle measures have been discussed as well. We have discussed the appropriate treatment goals of the above measures which include a 30% to 50% reduction in LDL levels. A significant component of residual cardiovascular risk is in adequate LDL C lowering in the large majority of patients. Additionally ultralow LDL-C is safe with no  apparent downside. The patient has a clear understanding of reasons for lipid management at this time. The patient is currently having no evidence of significant side effects of this medication at this time. Now ldl is 100 Abnormal ECG The patient has an EKG that has been reported to be abnormal. The time period of this abnormality has been years. The patient additionally has had symptoms including none NSR with LAE and abnormal changes in precordial leads cannot ro septal ischemia but this is old changes and has noraml echo from that time moving forward and no current cardiac sx Preoperative Assessment Profile Risk factors for cardiac complication with orthopedic surgery: Positive for: none Negative for: Diabetes, Cardiomyopathy or chf, Coronary artery disease, Chronic Kidney disease and Peripheral vascular disease Active cardiovascular conditions: Positive [id number] Negative for: Angina or anginal equivalent, Recent infartion, Congestive heart failure symptoms, Dysrhythmia and Symptomatic valve disease Functional capacity >4 Mets Risk of surgery and/or procedure low Possible need and adjustments in therapy prior to surgery and/or procedure no Overall risk of cardiac complication with surgery and/or procedure is low, <1%   Past Medical and Surgical History  Past Medical History Past Medical History:  Diagnosis Date   Barrett's esophagus  followed by Dr. 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   Medications and Allergies  Current Medications  Current Outpatient Medications  on File Prior to Visit  Medication Sig Dispense Refill   acetaminophen (TYLENOL) 500 MG tablet Take 1,000 mg by mouth nightly 30 tablet   amLODIPine (NORVASC) 10 MG tablet TAKE 1 TABLET EVERY DAY 90 tablet 1   cholecalciferol (VITAMIN D3) 1,000 unit capsule Take 1,000 Units by mouth once daily   clobetasoL (TEMOVATE) 0.05 % ointment APPLY TO AFFECTED AREA TWICE A DAY   hydroCHLOROthiazide (HYDRODIURIL) 12.5 MG tablet TAKE 1 TABLET EVERY DAY 90 tablet 1   labetaloL (TRANDATE) 100 MG tablet TAKE 1 TABLET TWICE DAILY 180 tablet 1   loratadine (CLARITIN) 10 mg tablet Take 10 mg by mouth once daily as needed for Allergies   omeprazole (PRILOSEC) 20 MG DR capsule TAKE 1 CAPSULE EVERY DAY 90 capsule 1   rosuvastatin (CRESTOR) 10 MG tablet TAKE 1 TABLET EVERY DAY 90 tablet 1   No current facility-administered medications on file prior to visit.   Allergies: Ciprofloxacin, Hydralazine, Dilaudid [hydromorphone (bulk)], Levaquin [levofloxacin], Lipitor [atorvastatin], and Sulfa (sulfonamide antibiotics)  Social and Family History  Social History reports that she quit smoking about 51 years ago. Her smoking use included cigarettes. She quit after 2.00 years of use. She has never used smokeless tobacco. She reports that she does not drink alcohol and does not use drugs.  Family History Family History  Problem Relation Age of Onset   Myocardial Infarction (Heart attack) Mother   Aneurysm Sister  ruptured   Review of Systems   Review of Systems  Positive for hip pain Negative for weight gain weight loss, weakness, vision change, hearing loss, cough, congestion, PND, orthopnea, heartburn, nausea, diaphoresis, vomiting, diarrhea, bloody stool, melena, stomach pain, leg weakness, leg cramping, leg blood clots, headache, blackouts, nosebleed, trouble swallowing, mouth  pain, urinary frequency, urination at night, muscle weakness, skin lesions, skin rashes, tingling ,ulcers, numbness, anxiety, and/or depression Physical Examination   Vitals:BP (!) 140/98 (BP Location: Left upper arm, Patient Position: Sitting, BP Cuff Size: Adult)   Pulse 78   Resp 16   Ht 167.6 cm (5\' 6" )   Wt 65.6 kg (144 lb 9.6 oz)   SpO2 97%   BMI 23.34 kg/m  Ht:167.6 cm (5\' 6" ) Wt:65.6 kg (144 lb 9.6 oz) [id number] surface area is 1.75 meters squared. Body mass index is 23.34 kg/m. Appearance: well appearing in no acute distress HEENT: Pupils equally reactive to light and accomodation, no xanthalasma  Neck: Supple, no apparent thyromegaly, masses, or lymphadenopathy  Lungs: normal respiratory effort; no crackles, no rhonchi, no wheezes Heart: Regular rate and rhythm. Normal S1 S2 No gallops, murmur, no rub, PMI is normal size and placement. carotid upstroke normal without bruit. Jugular venous pressure is normal Abdomen: soft, nontender, not distended with normal bowel sounds. No apparent hepatosplenomegally. Abdominal aorta is normal size without bruit Extremities: no edema, no ulcers, no clubbing, no cyanosis Peripheral Pulses: 2+ in upper extremities, 2+ femoral pulses bilaterally, 2+lower extremity  Musculoskeletal; Normal muscle tone without kyphosis Neurological: Oriented and Alert, Cranial nerves intact  Assessment   79 y.o. female with  Encounter Diagnoses  Name Primary?   Essential hypertension Yes   Pure hypercholesterolemia (LDL 100 - 06/26/19)   Abnormal ECG   Plan  -Proceed to surgery and/or invasive procedure without restriction to pre or post operative and/or procedural care. The patient is at lowest risk possible for cardiovascular complications with surgical intervention and/or invasive procedure. Currently has no evidence active and/or significant angina and/or congestive heart failure.  -We have had a long  discussion about the benefits of physical and  occupational rehabilitation. The patient is advised and encouraged to enroll for improvements in quality of life and reduced hospitalization. -There has been a review of medication management for hypertension control and of the current medical regimen used. The patient understands all the risks and benefits of hypertension control with these medications to reduce possible future cardiovascular complications and risk of disease. There will be no changes in medication regimen necessary today. -We have discussed risk reduction in the cardiovascular disease process by a continuation of lipid management with the current medication management for lipid reduction. The goals continue to be 30-50% lowering of LDL cholesterol in addition to lifestyle measures. This will include diet and improved activity level on a regular basis.The patient has an understanding of this discussion at this time and we will continue the appropriate strategy.  No orders of the defined types were placed in this encounter.  No follow-ups on file.  Carolyn Dibble, MD    Electronically signed by Carolyn Dibble, MD at 11/20/2019 3:58 PM EDT

## 2019-11-24 ENCOUNTER — Other Ambulatory Visit: Payer: Self-pay

## 2019-11-24 ENCOUNTER — Other Ambulatory Visit
Admission: RE | Admit: 2019-11-24 | Discharge: 2019-11-24 | Disposition: A | Discharge status: Home / Self Care | Payer: Medicare Other | Source: Ambulatory Visit | Attending: Orthopedic Surgery | Admitting: Orthopedic Surgery

## 2019-11-24 LAB — SARS CORONAVIRUS 2 (TAT 6-24 HRS): SARS Coronavirus 2: NEGATIVE

## 2019-11-25 NOTE — H&P (Signed)
ORTHOPAEDIC HISTORY & PHYSICAL Progress Notes Gwenlyn Fudge, [place] - 11/18/2019 2:45 PM EDT [facility] Chief Complaint:   Chief Complaint  Patient presents with  . Hip Pain  H & P LEFT HIP   History of Present Illness:   Carolyn Frye is a 79 y.o. female that presents to clinic today for her preoperative history and evaluation. Patient presents with her husband. The patient is scheduled to undergo a left total hip arthroplasty on 11/26/19 by Dr. Marry Guan. Her pain began around 4 years ago. The pain is located in the left hip and groin. She describes her pain as worse with weight bearing. She reports associated decrease in range of motion of the hip. She denies associated numbness or tingling.   The patient's symptoms have progressed to the point that they decrease her quality of life. The patient has previously undergone conservative treatment including NSAIDS and activity modification without adequate control of her symptoms.  Denies history of DVT, denies lumbar surgery. She sees a cardiologist once per year.   Past Medical, Surgical, Family, Social History, Allergies, Medications:   Past Medical History:  Past Medical History:  Diagnosis Date  . Barrett's esophagus  followed by Dr. Vira Agar  . Essential hypertension  . History of renal carcinoma  s/p right nephrectomy (followed by [facility])  . Osteoporosis, post-menopausal  (dexa 09/03/12)  . Pure hypercholesterolemia  . Vitamin D deficiency  (22.2 - 09/2013)   Past Surgical History:  Past Surgical History:  Procedure Laterality Date  . CATARACT EXTRACTION Bilateral  01/2015; 02/2015  . COLONOSCOPY 07/07/2007, 04/30/2002, 04/26/1998  Int Hemorrhoids, Diverticulosis: CBF 06/2017; Medically Inappropriate per PCP (dw)  . EGD 04/30/2002  Barrett's Esophagus  . EGD 12/27/2010, 06/27/2007, 02/19/2004  No Barrett's Seen: No repeat per RTE  . HYSTERECTOMY  . Hysterectomy with  uni-oophrectomy  . Melanoma removal from chest 05/2013  . s/p right nephrectomy (followed by [facility])   Current Medications:  Current Outpatient Medications  Medication Sig Dispense Refill  . acetaminophen (TYLENOL) 500 MG tablet Take 1,000 mg by mouth every 8 (eight) hours as needed 30 tablet  . amLODIPine (NORVASC) 10 MG tablet TAKE 1 TABLET EVERY DAY 90 tablet 1  . cholecalciferol (VITAMIN D3) 1,000 unit capsule Take 1,000 Units by mouth once daily  . clobetasoL (TEMOVATE) 0.05 % ointment APPLY TO AFFECTED AREA TWICE A DAY  . hydroCHLOROthiazide (HYDRODIURIL) 12.5 MG tablet TAKE 1 TABLET EVERY DAY 90 tablet 1  . ibuprofen (MOTRIN) 200 MG tablet Take 200 mg by mouth every 6 (six) hours as needed for Pain  . labetaloL (TRANDATE) 100 MG tablet TAKE 1 TABLET TWICE DAILY 180 tablet 1  . loratadine (CLARITIN) 10 mg tablet Take 10 mg by mouth once daily as needed for Allergies  . omeprazole (PRILOSEC) 20 MG DR capsule TAKE 1 CAPSULE EVERY DAY 90 capsule 1  . rosuvastatin (CRESTOR) 10 MG tablet TAKE 1 TABLET EVERY DAY 90 tablet 1   No current facility-administered medications for this visit.   Allergies:  Allergies  Allergen Reactions  . Ciprofloxacin Muscle Pain  Joint & muscle aches, weakening of tendons  . Hydralazine Palpitations  . Dilaudid [Hydromorphone (Bulk)] Headache  Caused severe headaches and room to spin  . Levaquin [Levofloxacin] Other (See Comments)  Arms tingling, strange dreams  . Lipitor [Atorvastatin] Muscle Pain  . Sulfa (Sulfonamide Antibiotics) Other (See Comments)  Flushed and redness of face, has taken BACTRIM and tolerated okay   Social  History:  Social History   Socioeconomic History  . Marital status: Married  Spouse name: Konrad Dolores  . Number of children: 2  . Years of education: 35  . Highest education level: High school graduate  Occupational History  . Occupation: Retired  Tobacco Use  . Smoking status: Former Smoker  Years: 2.00  Types: Cigarettes   Quit date: 06/19/1968  Years since quitting: 51.4  . Smokeless tobacco: Never Used  . Tobacco comment: Smoked 2 cigarettes weekly  Vaping Use  . Vaping Use: Never used  Substance and Sexual Activity  . Alcohol use: No  Alcohol/week: 0.0 standard drinks  . Drug use: No  . Sexual activity: Yes  Partners: Male  Birth control/protection: Post-menopausal, Surgical  Other Topics Concern  . Not on file  Social History Narrative  Marital status- Married  Lives with husband  Employment- Retired Surveyor, quantity)  Supplements- None  Exercise hx- Works outside; works in garden  Religious [place] Strain:  . Difficulty of Paying Living Expenses:  Food Insecurity:  . Worried About Charity fundraiser in the Last Year:  . Arboriculturist in the Last Year:  Transportation Needs:  . Film/video editor (Medical):  Marland Kitchen Lack of Transportation (Non-Medical):  Physical Activity:  . Days of Exercise per Week:  . Minutes of Exercise per Session:  Stress:  . Feeling of Stress :  Social Connections:  . Frequency of Communication with Friends and Family:  . Frequency of Social Gatherings with Friends and Family:  . Attends Religious Services:  . Active Member of Clubs or Organizations:  . Attends Archivist Meetings:  Marland Kitchen Marital Status:   Family History:  Family History  Problem Relation Age of Onset  . Myocardial Infarction (Heart attack) Mother  . Aneurysm Sister  ruptured   Review of Systems:   A 10+ ROS was performed, reviewed, and the pertinent orthopaedic findings are documented in the HPI.   Physical Examination:   BP (!) 140/90  Ht 167.6 cm (5\' 6" )  Wt 65.9 kg (145 lb 3.2 oz)  BMI 23.44 kg/m   Patient is a well-developed, well-nourished female in no acute distress. Patient has normal mood and affect. Patient is alert and oriented to person, place, and time.   HEENT:  Atraumatic, normocephalic. Pupils equal and reactive to light. Extraocular motion intact. Noninjected sclera.  Cardiovascular: Regular rate and rhythm, with no murmurs, rubs, or gallops. Distal pulses palpable.  Respiratory: Lungs clear to auscultation bilaterally.   Left Hip: Pelvic tilt: Negative Limb lengths: Equal with the patient standing Soft tissue swelling: Negative Erythema: Negative Crepitance: Negative Tenderness: Greater trochanter is nontender to palpation. Moderate pain is elicited by axial compression or extremes of rotation. Atrophy: No atrophy. Fair to good hip flexor and abductor strength. Range of Motion: EXT/FLEX: 0/0/90 ADD/ABD: 20/0/20 IR/ER: 0/0/30  Patient able to actively dorsiflex and plantarflex the left ankle.  Sensation is intact over the saphenous, lateral cutaneous, superficial fibular, and deep fibular nerve distributions.  Tests Performed/Reviewed:  X-rays  No new radiographs were obtained today. Previous radiographs were reviewed of the left hip and revealed complete loss of femoral acetabular joint space with bone-on-bone contact, subchondral sclerosis of bone, and significant osteophyte formation noted. Deformation of the femoral head is noted. Cystic changes noted throughout the femoral head. No fractures noted.  Impression:   ICD-10-CM  1. Primary osteoarthritis of left hip M16.12  Plan:   The patient has end-stage degenerative changes of the left hip. It was explained to the patient that the condition is progressive in nature. Having failed conservative treatment, the patient has elected to proceed with a total joint arthroplasty. The patient will undergo a total joint arthroplasty with Dr. Marry Guan. The risks of surgery, including blood clot and infection, were discussed with the patient. Measures to reduce these risks, including the use of anticoagulation, perioperative antibiotics, and early ambulation were discussed. The importance of  postoperative physical therapy was discussed with the patient. The patient elects to proceed with surgery. The patient is instructed to stop all blood thinners prior to surgery. The patient is instructed to call the hospital the day before surgery to learn of the proper arrival time.   Contact our office with any questions or concerns. Follow up as indicated, or sooner should any new problems arise, if conditions worsen, or if they are otherwise concerned.   Gwenlyn Fudge, PA-C [facility] [address] [place], [place] [address] Phone: [phone]  This note was generated in part with voice recognition software and I apologize for any typographical errors that were not detected and corrected.   Electronically signed by Gwenlyn Fudge, PA at 11/22/2019 11:56 PM EDT

## 2019-11-26 ENCOUNTER — Inpatient Hospital Stay
Admission: RE | Admit: 2019-11-26 | Discharge: 2019-11-28 | DRG: 470 | Disposition: A | Discharge status: Home Health | Payer: Medicare Other | Attending: Orthopedic Surgery | Admitting: Orthopedic Surgery

## 2019-11-26 ENCOUNTER — Inpatient Hospital Stay: Payer: Medicare Other

## 2019-11-26 ENCOUNTER — Inpatient Hospital Stay: Payer: Medicare Other | Admitting: Anesthesiology

## 2019-11-26 ENCOUNTER — Other Ambulatory Visit: Payer: Self-pay

## 2019-11-26 ENCOUNTER — Encounter: Payer: Self-pay | Admitting: Orthopedic Surgery

## 2019-11-26 ENCOUNTER — Encounter
Admission: RE | Disposition: A | Discharge status: Home / Self Care | Payer: Self-pay | Source: Home / Self Care | Attending: Orthopedic Surgery

## 2019-11-26 DIAGNOSIS — Z888 Allergy status to other drugs, medicaments and biological substances status: Secondary | ICD-10-CM

## 2019-11-26 DIAGNOSIS — I10 Essential (primary) hypertension: Secondary | ICD-10-CM | POA: Diagnosis present

## 2019-11-26 DIAGNOSIS — K219 Gastro-esophageal reflux disease without esophagitis: Secondary | ICD-10-CM | POA: Diagnosis present

## 2019-11-26 DIAGNOSIS — M1612 Unilateral primary osteoarthritis, left hip: Secondary | ICD-10-CM | POA: Diagnosis present

## 2019-11-26 DIAGNOSIS — Z882 Allergy status to sulfonamides status: Secondary | ICD-10-CM

## 2019-11-26 DIAGNOSIS — R110 Nausea: Secondary | ICD-10-CM | POA: Diagnosis not present

## 2019-11-26 DIAGNOSIS — Z85820 Personal history of malignant melanoma of skin: Secondary | ICD-10-CM | POA: Diagnosis not present

## 2019-11-26 DIAGNOSIS — E559 Vitamin D deficiency, unspecified: Secondary | ICD-10-CM | POA: Diagnosis present

## 2019-11-26 DIAGNOSIS — Z87891 Personal history of nicotine dependence: Secondary | ICD-10-CM

## 2019-11-26 DIAGNOSIS — K2270 Barrett's esophagus without dysplasia: Secondary | ICD-10-CM | POA: Diagnosis present

## 2019-11-26 DIAGNOSIS — Z90710 Acquired absence of both cervix and uterus: Secondary | ICD-10-CM

## 2019-11-26 DIAGNOSIS — Z85528 Personal history of other malignant neoplasm of kidney: Secondary | ICD-10-CM

## 2019-11-26 DIAGNOSIS — Z79899 Other long term (current) drug therapy: Secondary | ICD-10-CM | POA: Diagnosis not present

## 2019-11-26 DIAGNOSIS — Z905 Acquired absence of kidney: Secondary | ICD-10-CM | POA: Diagnosis not present

## 2019-11-26 DIAGNOSIS — Z87442 Personal history of urinary calculi: Secondary | ICD-10-CM

## 2019-11-26 DIAGNOSIS — Z96649 Presence of unspecified artificial hip joint: Secondary | ICD-10-CM

## 2019-11-26 DIAGNOSIS — Z20822 Contact with and (suspected) exposure to covid-19: Secondary | ICD-10-CM | POA: Diagnosis present

## 2019-11-26 DIAGNOSIS — E7800 Pure hypercholesterolemia, unspecified: Secondary | ICD-10-CM | POA: Diagnosis present

## 2019-11-26 DIAGNOSIS — J302 Other seasonal allergic rhinitis: Secondary | ICD-10-CM | POA: Diagnosis present

## 2019-11-26 DIAGNOSIS — M810 Age-related osteoporosis without current pathological fracture: Secondary | ICD-10-CM | POA: Diagnosis present

## 2019-11-26 DIAGNOSIS — Z885 Allergy status to narcotic agent status: Secondary | ICD-10-CM

## 2019-11-26 HISTORY — PX: TOTAL HIP ARTHROPLASTY: SHX124

## 2019-11-26 LAB — ABO/RH: ABO/RH(D): A POS

## 2019-11-26 SURGERY — ARTHROPLASTY, HIP, TOTAL,POSTERIOR APPROACH
Anesthesia: Spinal | Site: Hip | Laterality: Left

## 2019-11-26 MED ORDER — ONDANSETRON HCL 4 MG/2ML IJ SOLN
INTRAMUSCULAR | Status: DC | PRN
  Administered 2019-11-26: 4 mg via INTRAVENOUS

## 2019-11-26 MED ORDER — SODIUM CHLORIDE 0.9 % IV SOLN
INTRAVENOUS | Status: DC | PRN
  Administered 2019-11-26: 20 ug/min via INTRAVENOUS

## 2019-11-26 MED ORDER — CEFAZOLIN SODIUM-DEXTROSE 2-4 GM/100ML-% IV SOLN
2.0000 g | INTRAVENOUS | Status: AC
  Administered 2019-11-26: 2 g via INTRAVENOUS

## 2019-11-26 MED ORDER — METOCLOPRAMIDE HCL 5 MG/ML IJ SOLN
INTRAMUSCULAR | Status: AC
  Administered 2019-11-26: 5 mg via INTRAVENOUS
  Filled 2019-11-26: qty 2

## 2019-11-26 MED ORDER — HYDROCHLOROTHIAZIDE 25 MG PO TABS
12.5000 mg | ORAL_TABLET | Freq: Every day | ORAL | Status: DC
  Administered 2019-11-27 – 2019-11-28 (×2): 12.5 mg via ORAL
  Filled 2019-11-26 (×2): qty 1

## 2019-11-26 MED ORDER — VASOPRESSIN 20 UNIT/ML IV SOLN
INTRAVENOUS | Status: AC
  Filled 2019-11-26: qty 1

## 2019-11-26 MED ORDER — DIPHENHYDRAMINE HCL 12.5 MG/5ML PO ELIX: 12.5000 mg | ORAL_SOLUTION | ORAL | Status: DC | PRN

## 2019-11-26 MED ORDER — ENSURE PRE-SURGERY PO LIQD
296.0000 mL | Freq: Once | ORAL | Status: DC
  Filled 2019-11-26: qty 296

## 2019-11-26 MED ORDER — ONDANSETRON HCL 4 MG/2ML IJ SOLN: 4.0000 mg | Freq: Once | INTRAMUSCULAR | Status: AC | PRN

## 2019-11-26 MED ORDER — METOCLOPRAMIDE HCL 5 MG/ML IJ SOLN: 5.0000 mg | Freq: Three times a day (TID) | INTRAMUSCULAR | Status: DC | PRN

## 2019-11-26 MED ORDER — LIDOCAINE HCL (PF) 1 % IJ SOLN
INTRAMUSCULAR | Status: AC
  Filled 2019-11-26: qty 30

## 2019-11-26 MED ORDER — ONDANSETRON HCL 4 MG/2ML IJ SOLN
INTRAMUSCULAR | Status: AC
  Administered 2019-11-26: 4 mg via INTRAVENOUS
  Filled 2019-11-26: qty 2

## 2019-11-26 MED ORDER — FENTANYL CITRATE (PF) 100 MCG/2ML IJ SOLN
25.0000 ug | INTRAMUSCULAR | Status: DC | PRN
  Administered 2019-11-26 (×3): 25 ug via INTRAVENOUS

## 2019-11-26 MED ORDER — ONDANSETRON HCL 4 MG PO TABS: 4.0000 mg | ORAL_TABLET | Freq: Four times a day (QID) | ORAL | Status: DC | PRN

## 2019-11-26 MED ORDER — PROPOFOL 500 MG/50ML IV EMUL
INTRAVENOUS | Status: DC | PRN
  Administered 2019-11-26: 75 ug/kg/min via INTRAVENOUS

## 2019-11-26 MED ORDER — KETAMINE HCL 50 MG/ML IJ SOLN
INTRAMUSCULAR | Status: DC | PRN
  Administered 2019-11-26 (×3): 10 mg via INTRAMUSCULAR
  Administered 2019-11-26: 20 mg via INTRAMUSCULAR

## 2019-11-26 MED ORDER — DEXAMETHASONE SODIUM PHOSPHATE 10 MG/ML IJ SOLN: 8.0000 mg | Freq: Once | INTRAMUSCULAR | Status: AC

## 2019-11-26 MED ORDER — METOCLOPRAMIDE HCL 10 MG PO TABS: 5.0000 mg | ORAL_TABLET | Freq: Three times a day (TID) | ORAL | Status: DC | PRN

## 2019-11-26 MED ORDER — ACETAMINOPHEN 10 MG/ML IV SOLN
INTRAVENOUS | Status: DC | PRN
  Administered 2019-11-26: 1000 mg via INTRAVENOUS

## 2019-11-26 MED ORDER — CLOBETASOL PROPIONATE 0.05 % EX OINT
1.0000 "application " | TOPICAL_OINTMENT | Freq: Two times a day (BID) | CUTANEOUS | Status: DC
  Administered 2019-11-28: 1 via TOPICAL
  Filled 2019-11-26: qty 15

## 2019-11-26 MED ORDER — FERROUS SULFATE 325 (65 FE) MG PO TABS
325.0000 mg | ORAL_TABLET | Freq: Two times a day (BID) | ORAL | Status: DC
  Administered 2019-11-26 – 2019-11-28 (×4): 325 mg via ORAL
  Filled 2019-11-26 (×4): qty 1

## 2019-11-26 MED ORDER — SODIUM CHLORIDE 0.9 % IV SOLN: INTRAVENOUS | Status: DC

## 2019-11-26 MED ORDER — CEFAZOLIN SODIUM-DEXTROSE 2-4 GM/100ML-% IV SOLN
2.0000 g | Freq: Four times a day (QID) | INTRAVENOUS | Status: AC
  Administered 2019-11-26 – 2019-11-27 (×4): 2 g via INTRAVENOUS
  Filled 2019-11-26 (×4): qty 100

## 2019-11-26 MED ORDER — PROPOFOL 500 MG/50ML IV EMUL
INTRAVENOUS | Status: AC
  Filled 2019-11-26: qty 50

## 2019-11-26 MED ORDER — TRANEXAMIC ACID-NACL 1000-0.7 MG/100ML-% IV SOLN
INTRAVENOUS | Status: AC
  Administered 2019-11-26: 1000 mg via INTRAVENOUS
  Filled 2019-11-26: qty 100

## 2019-11-26 MED ORDER — MIDAZOLAM HCL 5 MG/5ML IJ SOLN
INTRAMUSCULAR | Status: DC | PRN
  Administered 2019-11-26: 1 mg via INTRAVENOUS

## 2019-11-26 MED ORDER — PHENOL 1.4 % MT LIQD
1.0000 | OROMUCOSAL | Status: DC | PRN
  Filled 2019-11-26: qty 177

## 2019-11-26 MED ORDER — TRANEXAMIC ACID-NACL 1000-0.7 MG/100ML-% IV SOLN
INTRAVENOUS | Status: AC
  Filled 2019-11-26: qty 100

## 2019-11-26 MED ORDER — CHLORHEXIDINE GLUCONATE 4 % EX LIQD: 60.0000 mL | Freq: Once | CUTANEOUS | Status: DC

## 2019-11-26 MED ORDER — MENTHOL 3 MG MT LOZG
1.0000 | LOZENGE | OROMUCOSAL | Status: DC | PRN
  Filled 2019-11-26: qty 9

## 2019-11-26 MED ORDER — ACETAMINOPHEN 325 MG PO TABS: 325.0000 mg | ORAL_TABLET | Freq: Four times a day (QID) | ORAL | Status: DC | PRN

## 2019-11-26 MED ORDER — GABAPENTIN 300 MG PO CAPS
300.0000 mg | ORAL_CAPSULE | Freq: Every day | ORAL | Status: DC
  Administered 2019-11-26 – 2019-11-27 (×2): 300 mg via ORAL
  Filled 2019-11-26 (×2): qty 1

## 2019-11-26 MED ORDER — ACETAMINOPHEN 10 MG/ML IV SOLN
1000.0000 mg | Freq: Four times a day (QID) | INTRAVENOUS | Status: AC
  Administered 2019-11-26 (×3): 1000 mg via INTRAVENOUS
  Filled 2019-11-26 (×4): qty 100

## 2019-11-26 MED ORDER — LABETALOL HCL 100 MG PO TABS
100.0000 mg | ORAL_TABLET | Freq: Two times a day (BID) | ORAL | Status: DC
  Administered 2019-11-26 – 2019-11-28 (×4): 100 mg via ORAL
  Filled 2019-11-26 (×5): qty 1

## 2019-11-26 MED ORDER — KETAMINE HCL 50 MG/ML IJ SOLN
INTRAMUSCULAR | Status: AC
  Filled 2019-11-26: qty 10

## 2019-11-26 MED ORDER — LIDOCAINE HCL (PF) 2 % IJ SOLN
INTRAMUSCULAR | Status: AC
  Filled 2019-11-26: qty 5

## 2019-11-26 MED ORDER — MAGNESIUM HYDROXIDE 400 MG/5ML PO SUSP
30.0000 mL | Freq: Every day | ORAL | Status: DC
  Filled 2019-11-26: qty 30

## 2019-11-26 MED ORDER — ROSUVASTATIN CALCIUM 10 MG PO TABS
10.0000 mg | ORAL_TABLET | Freq: Every day | ORAL | Status: DC
  Administered 2019-11-27 – 2019-11-28 (×2): 10 mg via ORAL
  Filled 2019-11-26 (×2): qty 1

## 2019-11-26 MED ORDER — VASOPRESSIN 20 UNIT/ML IV SOLN
INTRAVENOUS | Status: DC | PRN
Start: 2019-11-26 — End: 2019-11-26
  Administered 2019-11-26 (×7): 1 [IU] via INTRAVENOUS

## 2019-11-26 MED ORDER — FENTANYL CITRATE (PF) 100 MCG/2ML IJ SOLN
INTRAMUSCULAR | Status: AC
  Administered 2019-11-26: 25 ug via INTRAVENOUS
  Filled 2019-11-26: qty 2

## 2019-11-26 MED ORDER — ENOXAPARIN SODIUM 30 MG/0.3ML ~~LOC~~ SOLN
30.0000 mg | Freq: Two times a day (BID) | SUBCUTANEOUS | Status: DC
  Administered 2019-11-27 – 2019-11-28 (×3): 30 mg via SUBCUTANEOUS
  Filled 2019-11-26 (×3): qty 0.3

## 2019-11-26 MED ORDER — METOCLOPRAMIDE HCL 10 MG PO TABS
10.0000 mg | ORAL_TABLET | Freq: Three times a day (TID) | ORAL | Status: DC
  Administered 2019-11-26 – 2019-11-28 (×7): 10 mg via ORAL
  Filled 2019-11-26 (×7): qty 1

## 2019-11-26 MED ORDER — DEXAMETHASONE SODIUM PHOSPHATE 10 MG/ML IJ SOLN
INTRAMUSCULAR | Status: AC
  Administered 2019-11-26: 8 mg via INTRAVENOUS
  Filled 2019-11-26: qty 1

## 2019-11-26 MED ORDER — OXYCODONE HCL 5 MG PO TABS
5.0000 mg | ORAL_TABLET | ORAL | Status: DC | PRN
  Administered 2019-11-26 – 2019-11-28 (×2): 5 mg via ORAL
  Filled 2019-11-26: qty 1

## 2019-11-26 MED ORDER — BUPIVACAINE HCL (PF) 0.5 % IJ SOLN
INTRAMUSCULAR | Status: DC | PRN
  Administered 2019-11-26: 3 mL

## 2019-11-26 MED ORDER — HYDROMORPHONE HCL 1 MG/ML IJ SOLN
0.5000 mg | INTRAMUSCULAR | Status: DC | PRN
  Administered 2019-11-26: 1 mg via INTRAVENOUS
  Filled 2019-11-26: qty 1

## 2019-11-26 MED ORDER — BUPIVACAINE HCL (PF) 0.5 % IJ SOLN
INTRAMUSCULAR | Status: AC
  Filled 2019-11-26: qty 10

## 2019-11-26 MED ORDER — AMLODIPINE BESYLATE 10 MG PO TABS
10.0000 mg | ORAL_TABLET | Freq: Every day | ORAL | Status: DC
  Administered 2019-11-27 – 2019-11-28 (×2): 10 mg via ORAL
  Filled 2019-11-26 (×2): qty 1

## 2019-11-26 MED ORDER — ONDANSETRON HCL 4 MG/2ML IJ SOLN
INTRAMUSCULAR | Status: AC
  Filled 2019-11-26: qty 2

## 2019-11-26 MED ORDER — NEOMYCIN-POLYMYXIN B GU 40-200000 IR SOLN
Status: AC
  Filled 2019-11-26: qty 1

## 2019-11-26 MED ORDER — GABAPENTIN 300 MG PO CAPS
ORAL_CAPSULE | ORAL | Status: AC
  Administered 2019-11-26: 300 mg via ORAL
  Filled 2019-11-26: qty 1

## 2019-11-26 MED ORDER — EPHEDRINE 5 MG/ML INJ
INTRAVENOUS | Status: AC
  Filled 2019-11-26: qty 10

## 2019-11-26 MED ORDER — EPHEDRINE SULFATE 50 MG/ML IJ SOLN
INTRAMUSCULAR | Status: DC | PRN
  Administered 2019-11-26 (×5): 10 mg via INTRAVENOUS

## 2019-11-26 MED ORDER — GABAPENTIN 300 MG PO CAPS: 300.0000 mg | ORAL_CAPSULE | Freq: Once | ORAL | Status: AC

## 2019-11-26 MED ORDER — CHLORHEXIDINE GLUCONATE 0.12 % MT SOLN
OROMUCOSAL | Status: AC
  Administered 2019-11-26: 15 mL via OROMUCOSAL
  Filled 2019-11-26: qty 15

## 2019-11-26 MED ORDER — SENNOSIDES-DOCUSATE SODIUM 8.6-50 MG PO TABS
1.0000 | ORAL_TABLET | Freq: Two times a day (BID) | ORAL | Status: DC
  Administered 2019-11-26 – 2019-11-27 (×3): 1 via ORAL
  Filled 2019-11-26 (×4): qty 1

## 2019-11-26 MED ORDER — LACTATED RINGERS IV SOLN: INTRAVENOUS | Status: DC

## 2019-11-26 MED ORDER — BISACODYL 10 MG RE SUPP: 10.0000 mg | Freq: Every day | RECTAL | Status: DC | PRN

## 2019-11-26 MED ORDER — TRAMADOL HCL 50 MG PO TABS: 50.0000 mg | ORAL_TABLET | ORAL | Status: DC | PRN

## 2019-11-26 MED ORDER — TRANEXAMIC ACID-NACL 1000-0.7 MG/100ML-% IV SOLN
1000.0000 mg | INTRAVENOUS | Status: AC
  Administered 2019-11-26: 1000 mg via INTRAVENOUS

## 2019-11-26 MED ORDER — VITAMIN D 25 MCG (1000 UNIT) PO TABS
1000.0000 [IU] | ORAL_TABLET | Freq: Every day | ORAL | Status: DC
  Administered 2019-11-27 – 2019-11-28 (×2): 1000 [IU] via ORAL
  Filled 2019-11-26 (×3): qty 1

## 2019-11-26 MED ORDER — TRANEXAMIC ACID-NACL 1000-0.7 MG/100ML-% IV SOLN: 1000.0000 mg | Freq: Once | INTRAVENOUS | Status: AC

## 2019-11-26 MED ORDER — NEOMYCIN-POLYMYXIN B GU 40-200000 IR SOLN
Status: DC | PRN
  Administered 2019-11-26: 14 mL

## 2019-11-26 MED ORDER — CHLORHEXIDINE GLUCONATE 0.12 % MT SOLN: 15.0000 mL | Freq: Once | OROMUCOSAL | Status: AC

## 2019-11-26 MED ORDER — FLEET ENEMA 7-19 GM/118ML RE ENEM: 1.0000 | ENEMA | Freq: Once | RECTAL | Status: DC | PRN

## 2019-11-26 MED ORDER — PHENYLEPHRINE HCL (PRESSORS) 10 MG/ML IV SOLN
INTRAVENOUS | Status: DC | PRN
  Administered 2019-11-26 (×2): 100 ug via INTRAVENOUS

## 2019-11-26 MED ORDER — CEFAZOLIN SODIUM-DEXTROSE 2-4 GM/100ML-% IV SOLN
INTRAVENOUS | Status: AC
  Filled 2019-11-26: qty 100

## 2019-11-26 MED ORDER — ALUM & MAG HYDROXIDE-SIMETH 200-200-20 MG/5ML PO SUSP: 30.0000 mL | ORAL | Status: DC | PRN

## 2019-11-26 MED ORDER — ORAL CARE MOUTH RINSE: 15.0000 mL | Freq: Once | OROMUCOSAL | Status: AC

## 2019-11-26 MED ORDER — PANTOPRAZOLE SODIUM 40 MG PO TBEC
40.0000 mg | DELAYED_RELEASE_TABLET | Freq: Two times a day (BID) | ORAL | Status: DC
  Administered 2019-11-26 – 2019-11-28 (×4): 40 mg via ORAL
  Filled 2019-11-26 (×5): qty 1

## 2019-11-26 MED ORDER — OXYCODONE HCL 5 MG PO TABS
10.0000 mg | ORAL_TABLET | ORAL | Status: DC | PRN
  Administered 2019-11-27 – 2019-11-28 (×4): 10 mg via ORAL
  Filled 2019-11-26 (×5): qty 2

## 2019-11-26 MED ORDER — ACETAMINOPHEN 10 MG/ML IV SOLN
INTRAVENOUS | Status: AC
  Filled 2019-11-26: qty 100

## 2019-11-26 MED ORDER — MIDAZOLAM HCL 2 MG/2ML IJ SOLN
INTRAMUSCULAR | Status: AC
  Filled 2019-11-26: qty 2

## 2019-11-26 MED ORDER — ONDANSETRON HCL 4 MG/2ML IJ SOLN
4.0000 mg | Freq: Four times a day (QID) | INTRAMUSCULAR | Status: DC | PRN
  Administered 2019-11-26: 4 mg via INTRAVENOUS
  Filled 2019-11-26: qty 2

## 2019-11-26 MED ORDER — PROPOFOL 10 MG/ML IV BOLUS
INTRAVENOUS | Status: AC
  Filled 2019-11-26: qty 20

## 2019-11-26 SURGICAL SUPPLY — 60 items
ARTICULEZE HEAD (Hips) ×3 IMPLANT
BLADE DRUM FLTD (BLADE) ×3 IMPLANT
BLADE SAW 90X25X1.19 OSCILLAT (BLADE) ×3 IMPLANT
CANISTER SUCT 1200ML W/VALVE (MISCELLANEOUS) ×3 IMPLANT
CANISTER SUCT 3000ML PPV (MISCELLANEOUS) ×6 IMPLANT
CARTRIDGE OIL MAESTRO DRILL (MISCELLANEOUS) ×1 IMPLANT
COVER WAND RF STERILE (DRAPES) ×3 IMPLANT
DIFFUSER DRILL AIR PNEUMATIC (MISCELLANEOUS) ×3 IMPLANT
DRAPE 3/4 80X56 (DRAPES) ×3 IMPLANT
DRAPE INCISE IOBAN 66X60 STRL (DRAPES) ×3 IMPLANT
DRSG DERMACEA 8X12 NADH (GAUZE/BANDAGES/DRESSINGS) ×3 IMPLANT
DRSG OPSITE POSTOP 4X12 (GAUZE/BANDAGES/DRESSINGS) ×3 IMPLANT
DRSG OPSITE POSTOP 4X14 (GAUZE/BANDAGES/DRESSINGS) IMPLANT
DRSG TEGADERM 4X4.75 (GAUZE/BANDAGES/DRESSINGS) ×3 IMPLANT
DURAPREP 26ML APPLICATOR (WOUND CARE) ×3 IMPLANT
ELECT REM PT RETURN 9FT ADLT (ELECTROSURGICAL) ×3
ELECTRODE REM PT RTRN 9FT ADLT (ELECTROSURGICAL) ×1 IMPLANT
GLOVE BIO SURGEON STRL SZ7.5 (GLOVE) ×6 IMPLANT
GLOVE BIOGEL M STRL SZ7.5 (GLOVE) ×6 IMPLANT
GLOVE BIOGEL PI IND STRL 7.5 (GLOVE) ×1 IMPLANT
GLOVE BIOGEL PI INDICATOR 7.5 (GLOVE) ×2
GLOVE INDICATOR 8.0 STRL GRN (GLOVE) ×3 IMPLANT
GOWN STRL REUS W/ TWL LRG LVL3 (GOWN DISPOSABLE) ×2 IMPLANT
GOWN STRL REUS W/ TWL XL LVL3 (GOWN DISPOSABLE) ×1 IMPLANT
GOWN STRL REUS W/TWL LRG LVL3 (GOWN DISPOSABLE) ×4
GOWN STRL REUS W/TWL XL LVL3 (GOWN DISPOSABLE) ×2
HEAD ARTICULEZE (Hips) IMPLANT
HEMOVAC 400CC 10FR (MISCELLANEOUS) ×3 IMPLANT
HOLDER FOLEY CATH W/STRAP (MISCELLANEOUS) ×3 IMPLANT
HOOD PEEL AWAY FLYTE STAYCOOL (MISCELLANEOUS) ×6 IMPLANT
KIT TURNOVER KIT A (KITS) ×3 IMPLANT
LINER MARATHON 4 NEUTRAL 36X56 (Hips) ×2 IMPLANT
MANIFOLD NEPTUNE II (INSTRUMENTS) ×3 IMPLANT
NDL SAFETY ECLIPSE 18X1.5 (NEEDLE) ×1 IMPLANT
NEEDLE HYPO 18GX1.5 SHARP (NEEDLE) ×2
NS IRRIG 500ML POUR BTL (IV SOLUTION) ×3 IMPLANT
OIL CARTRIDGE MAESTRO DRILL (MISCELLANEOUS) ×3
PACK HIP PROSTHESIS (MISCELLANEOUS) ×3 IMPLANT
PENCIL SMOKE ULTRAEVAC 22 CON (MISCELLANEOUS) ×3 IMPLANT
PIN SECT CUP 56MM (Hips) ×2 IMPLANT
PIN STEIN THRED 5/32 (Pin) ×3 IMPLANT
PULSAVAC PLUS IRRIG FAN TIP (DISPOSABLE) ×3
SOL .9 NS 3000ML IRR  AL (IV SOLUTION) ×2
SOL .9 NS 3000ML IRR UROMATIC (IV SOLUTION) ×1 IMPLANT
SOL PREP PVP 2OZ (MISCELLANEOUS) ×3
SOLUTION PREP PVP 2OZ (MISCELLANEOUS) ×1 IMPLANT
SPONGE DRAIN TRACH 4X4 STRL 2S (GAUZE/BANDAGES/DRESSINGS) ×3 IMPLANT
STAPLER SKIN PROX 35W (STAPLE) ×3 IMPLANT
STEM FEM CMNTLSS LG AML 15.0 (Hips) ×2 IMPLANT
SUT ETHIBOND #5 BRAIDED 30INL (SUTURE) ×3 IMPLANT
SUT VIC AB 0 CT1 36 (SUTURE) ×3 IMPLANT
SUT VIC AB 1 CT1 36 (SUTURE) ×6 IMPLANT
SUT VIC AB 2-0 CT1 27 (SUTURE) ×2
SUT VIC AB 2-0 CT1 TAPERPNT 27 (SUTURE) ×1 IMPLANT
SYR 20ML LL LF (SYRINGE) ×3 IMPLANT
TAPE CLOTH 3X10 WHT NS LF (GAUZE/BANDAGES/DRESSINGS) ×3 IMPLANT
TAPE TRANSPORE STRL 2 31045 (GAUZE/BANDAGES/DRESSINGS) ×3 IMPLANT
TIP FAN IRRIG PULSAVAC PLUS (DISPOSABLE) ×1 IMPLANT
TOWEL OR 17X26 4PK STRL BLUE (TOWEL DISPOSABLE) ×3 IMPLANT
TRAY FOLEY MTR SLVR 16FR STAT (SET/KITS/TRAYS/PACK) ×3 IMPLANT

## 2019-11-26 NOTE — Anesthesia Preprocedure Evaluation (Signed)
Anesthesia Evaluation  Patient identified by MRN, date of birth, ID band Patient awake    Reviewed: Allergy & Precautions, NPO status , Patient's Chart, lab work & pertinent test results  History of Anesthesia Complications Negative for: history of anesthetic complications  Airway Mallampati: II       Dental   Pulmonary neg sleep apnea, neg COPD, Not current smoker, former smoker,           Cardiovascular hypertension, Pt. on medications (-) Past MI and (-) CHF + dysrhythmias (hx several years ago, no problems since) (-) Valvular Problems/Murmurs     Neuro/Psych neg Seizures    GI/Hepatic Neg liver ROS, GERD  Medicated and Controlled,  Endo/Other  neg diabetes  Renal/GU Renal InsufficiencyRenal disease (s/p nephrectomy for renal CA)     Musculoskeletal   Abdominal   Peds  Hematology   Anesthesia Other Findings   Reproductive/Obstetrics                             Anesthesia Physical Anesthesia Plan  ASA: III  Anesthesia Plan: Spinal   Post-op Pain Management:    Induction:   PONV Risk Score and Plan:   Airway Management Planned:   Additional Equipment:   Intra-op Plan:   Post-operative Plan:   Informed Consent: I have reviewed the patients History and Physical, chart, labs and discussed the procedure including the risks, benefits and alternatives for the proposed anesthesia with the patient or authorized representative who has indicated his/her understanding and acceptance.       Plan Discussed with:   Anesthesia Plan Comments:         Anesthesia Quick Evaluation

## 2019-11-26 NOTE — Anesthesia Procedure Notes (Signed)
Spinal  Start time: 11/26/2019 7:20 AM End time: 11/26/2019 7:23 AM Staffing Performed: resident/CRNA  Anesthesiologist: Gunnar Fusi, MD Resident/CRNA: Jerrye Noble, CRNA Preanesthetic Checklist Completed: patient identified, IV checked, site marked, risks and benefits discussed, surgical consent, monitors and equipment checked, pre-op evaluation and timeout performed Spinal Block Patient position: sitting Prep: ChloraPrep Patient monitoring: continuous pulse ox and blood pressure Approach: midline Location: L3-4 Injection technique: single-shot Needle Needle type: Pencan  Needle gauge: 24 G Assessment Sensory level: T10 Additional Notes Negative heme, negative paresthesia.

## 2019-11-26 NOTE — Transfer of Care (Signed)
Immediate Anesthesia Transfer of Care Note  Patient: Carolyn Frye  Procedure(s) Performed: TOTAL HIP ARTHROPLASTY (Left Hip)  Patient Location: PACU  Anesthesia Type:Spinal  Level of Consciousness: awake and drowsy  Airway & Oxygen Therapy: Patient Spontanous Breathing  Post-op Assessment: Report given to RN and Post -op Vital signs reviewed and stable  Post vital signs: Reviewed and stable  Last Vitals:  Vitals Value Taken Time  BP 118/70 11/26/19 1045  Temp 36 C 11/26/19 1046  Pulse 76 11/26/19 1048  Resp 30 11/26/19 1048  SpO2 97 % 11/26/19 1048  Vitals shown include unvalidated device data.  Last Pain:  Vitals:   11/26/19 0635  TempSrc: Tympanic  PainSc: 0-No pain         Complications: No apparent anesthesia complications

## 2019-11-26 NOTE — Op Note (Signed)
OPERATIVE NOTE  DATE OF SURGERY:  11/26/2019  PATIENT NAME:  Carolyn Frye   DOB: [birth year]  MRN: [id number]  PRE-OPERATIVE DIAGNOSIS: Degenerative arthrosis of the left hip, primary  POST-OPERATIVE DIAGNOSIS:  Same  PROCEDURE:  Left total hip arthroplasty  SURGEON:  Marciano Sequin. M.D.  ASSISTANT: Cassell Smiles, PA-C (present and scrubbed throughout the case, critical for assistance with exposure, retraction, instrumentation, and closure)  ANESTHESIA: spinal  ESTIMATED BLOOD LOSS: 100 mL  FLUIDS REPLACED: 1600 mL of crystalloid  DRAINS: 2 medium drains to a Hemovac reservoir  IMPLANTS UTILIZED: DePuy 15 mm large stature AML femoral stem, 56 mm OD Pinnacle 100 acetabular component, +4 mm neutral Pinnacle Marathon polyethylene insert, and a 36 mm M-SPEC +5 mm hip ball  INDICATIONS FOR SURGERY: CELA NEWCOM is a 79 y.o. year old female with a long history of progressive hip and groin  pain. X-rays demonstrated severe degenerative changes. The patient had not seen any significant improvement despite conservative nonsurgical intervention. After discussion of the risks and benefits of surgical intervention, the patient expressed understanding of the risks benefits and agree with plans for total hip arthroplasty.   The risks, benefits, and alternatives were discussed at length including but not limited to the risks of infection, bleeding, nerve injury, stiffness, blood clots, the need for revision surgery, limb length inequality, dislocation, cardiopulmonary complications, among others, and they were willing to proceed.  PROCEDURE IN DETAIL: The patient was brought into the operating room and, after adequate spinal anesthesia was achieved, the patient was placed in a right lateral decubitus position. Axillary roll was placed and all bony prominences were well-padded. The patient's left hip was cleaned and prepped with alcohol and DuraPrep and draped in the usual sterile  fashion. A "timeout" was performed as per usual protocol. A lateral curvilinear incision was made gently curving towards the posterior superior iliac spine. The IT band was incised in line with the skin incision and the fibers of the gluteus maximus were split in line. The piriformis tendon was identified, skeletonized, and incised at its insertion to the proximal femur and reflected posteriorly. A T type posterior capsulotomy was performed. Prior to dislocation of the femoral head, a threaded Steinmann pin was inserted through a separate stab incision into the pelvis superior to the acetabulum and bent in the form of a stylus so as to assess limb length and hip offset throughout the procedure. The femoral head was then dislocated posteriorly. Inspection of the femoral head demonstrated severe degenerative changes with full-thickness loss of articular cartilage. The femoral neck cut was performed using an oscillating saw. The anterior capsule was elevated off of the femoral neck using a periosteal elevator. Attention was then directed to the acetabulum. The remnant of the labrum was excised using electrocautery. Inspection of the acetabulum also demonstrated significant degenerative changes. The acetabulum was reamed in sequential fashion up to a 55 mm diameter. Good punctate bleeding bone was encountered. A 56 mm Pinnacle 100 acetabular component was positioned and impacted into place. Good scratch fit was appreciated. A +4 mm neutral polyethylene trial was inserted.  Attention was then directed to the proximal femur. A hole for reaming of the proximal femoral canal was created using a high-speed burr. The femoral canal was reamed in sequential fashion up to a 14.5 mm diameter. This allowed for approximately 6 cm of scratch fit. Serial broaches were inserted up to a 15 mm large stature femoral broach. Calcar region was planed and a trial  reduction was performed using a 36 mm hip ball with a +5 mm neck length.  Good equalization of limb lengths and hip offset was appreciated and excellent stability was noted both anteriorly and posteriorly. Trial components were removed. The acetabular shell was irrigated with copious amounts of normal saline with antibiotic solution and suctioned dry. A +4 mm neutral Pinnacle Marathon polyethylene insert was positioned and impacted into place. Next, a 15 mm large stature AML femoral stem was positioned and impacted into place. Excellent scratch fit was appreciated. A trial reduction was again performed with a 36 mm hip ball with a +5 mm neck length. Again, good equalization of limb lengths was appreciated and excellent stability appreciated both anteriorly and posteriorly. The hip was then dislocated and the trial hip ball was removed. The Morse taper was cleaned and dried. A 36 mm M-SPEC hip ball with a +5 mm neck length was placed on the trunnion and impacted into place. The hip was then reduced and placed through range of motion. Excellent stability was appreciated both anteriorly and posteriorly.  The wound was irrigated with copious amounts of normal saline with antibiotic solution and suctioned dry. Good hemostasis was appreciated. The posterior capsulotomy was repaired using #5 Ethibond. Piriformis tendon was reapproximated to the undersurface of the gluteus medius tendon using #5 Ethibond. Two medium drains were placed in the wound bed and brought out through separate stab incisions to be attached to a Hemovac reservoir. The IT band was reapproximated using interrupted sutures of #1 Vicryl. Subcutaneous tissue was approximated using first #0 Vicryl followed by #2-0 Vicryl. The skin was closed with skin staples.  The patient tolerated the procedure well and was transported to the recovery room in stable condition.   Marciano Sequin., M.D.

## 2019-11-26 NOTE — Progress Notes (Signed)
PT Cancellation Note  Patient Details Name: Carolyn Frye MRN: [id number] DOB: [birth year]   Cancelled Treatment:    Reason Eval/Treat Not Completed: Medical issues which prohibited therapy. Patient vomiting and feeling dizzy. RN notified, will re-attempt later if time allows.      Breccan Galant 11/26/2019, 3:36 PM

## 2019-11-26 NOTE — Evaluation (Signed)
Physical Therapy Evaluation Patient Details Name: Carolyn Frye MRN: [id number] DOB: 14-Aug-1940 Today's Date: 11/26/2019   History of Present Illness  Patient is s/p left THA.  Clinical Impression  Patient having nausea since surgery, reports she is feeling better after anti-nausea medication. Agrees to get up to edge of bed. Upon sitting up noticed that patient's bed was wet and she requested to use the bathroom. Patient began having nausea and vomiting again with mobility. Required min guard for bed mobility. Cues for precautions. Min assist for sit to stand with RW and pivoted around to [facility] then back to bed. Overall patient moving very well despite nausea and vomiting. She will continue to benefit from skilled PT while here to improve strength and functional mobility to return home safely at discharge.      Follow Up Recommendations Home health PT;Supervision/Assistance - 24 hour    Equipment Recommendations  3in1 (PT)    Recommendations for Other Services       Precautions / Restrictions Precautions Precautions: Posterior Hip;Fall Precaution Booklet Issued: No Restrictions Weight Bearing Restrictions: No Other Position/Activity Restrictions: WBAT      Mobility  Bed Mobility Overal bed mobility: Needs Assistance Bed Mobility: Supine to Sit;Sit to Supine     Supine to sit: Min guard Sit to supine: Min guard   General bed mobility comments: able to get herself seated on edge of bed then back into bed, assist needed to manage lines, requires close supervision to min guard.  Transfers Overall transfer level: Needs assistance Equipment used: Rolling walker (2 wheeled) Transfers: Sit to/from Stand Sit to Stand: Min guard         General transfer comment: cues for sit to stand, demonstrates good ability despite nausea  Ambulation/Gait Ambulation/Gait assistance: Min guard Gait Distance (Feet): 4 Feet Assistive device: Rolling walker (2 wheeled) Gait  Pattern/deviations: Step-to pattern;Decreased step length - right;Decreased step length - left;Shuffle Gait velocity: decr   General Gait Details: patient ambulated to [facility] then back to bed with RW and min guard.  Stairs            Wheelchair Mobility    Modified Rankin (Stroke Patients Only)       Balance Overall balance assessment: Needs assistance Sitting-balance support: Feet supported;Single extremity supported Sitting balance-Leahy Scale: Good     Standing balance support: Bilateral upper extremity supported;During functional activity Standing balance-Leahy Scale: Fair Standing balance comment: reliant on RW and min guard assist at this time for safety                             Pertinent Vitals/Pain Pain Assessment: Faces Faces Pain Scale: Hurts even more Pain Location: L hip with movement Pain Descriptors / Indicators: Aching;Sore;Discomfort;Grimacing;Guarding Pain Intervention(s): Monitored during session;Premedicated before session;Repositioned    Home Living Family/patient expects to be discharged to:: Private residence Living Arrangements: Spouse/significant other;Children             Home Equipment: Kasandra Knudsen - single point Additional Comments: reports she was using SPC prior to admission some of the time due to pain    Prior Function Level of Independence: Independent with assistive device(s)               Hand Dominance        Extremity/Trunk Assessment   Upper Extremity Assessment Upper Extremity Assessment: Overall WFL for tasks assessed    Lower Extremity Assessment Lower Extremity Assessment: LLE deficits/detail LLE: Unable to fully  assess due to pain LLE Sensation: WNL    Cervical / Trunk Assessment Cervical / Trunk Assessment: Normal  Communication   Communication: HOH  Cognition Arousal/Alertness: Awake/alert Behavior During Therapy: WFL for tasks assessed/performed Overall Cognitive Status: Within Functional  Limits for tasks assessed                                 General Comments: patient nauseated and dizzy during mobility, distracted during assessment due to this.      General Comments      Exercises     Assessment/Plan    PT Assessment Patient needs continued PT services  PT Problem List Decreased strength;Decreased mobility;Decreased safety awareness;Decreased knowledge of precautions;Decreased activity tolerance;Decreased balance;Decreased range of motion;Pain       PT Treatment Interventions DME instruction;Therapeutic activities;Gait training;Therapeutic exercise;Patient/family education;Stair training;Balance training;Functional mobility training;Neuromuscular re-education    PT Goals (Current goals can be found in the Care Plan section)  Acute Rehab PT Goals Patient Stated Goal: none stated Time For Goal Achievement: 12/03/19    Frequency BID   Barriers to discharge        Co-evaluation               AM-PAC PT "6 Clicks" Mobility  Outcome Measure Help needed turning from your back to your side while in a flat bed without using bedrails?: A Little Help needed moving from lying on your back to sitting on the side of a flat bed without using bedrails?: A Little Help needed moving to and from a bed to a chair (including a wheelchair)?: A Little Help needed standing up from a chair using your arms (e.g., wheelchair or bedside chair)?: A Little Help needed to walk in hospital room?: A Little Help needed climbing 3-5 steps with a railing? : A Lot 6 Click Score: 17    End of Session Equipment Utilized During Treatment: Gait belt Activity Tolerance: Patient tolerated treatment well;Patient limited by pain;Other (comment)(patient with nausea and vomiting during session) Patient left: in bed;with call bell/phone within reach;with bed alarm set;with nursing/sitter in room;with SCD's reapplied Nurse Communication: Mobility status PT Visit Diagnosis:  Unsteadiness on feet (R26.81);Other abnormalities of gait and mobility (R26.89);Muscle weakness (generalized) (M62.81);Pain;Difficulty in walking, not elsewhere classified (R26.2) Pain - Right/Left: Left Pain - part of body: Hip    Time: 7782-4235 PT Time Calculation (min) (ACUTE ONLY): 21 min   Charges:   PT Evaluation $PT Eval Moderate Complexity: 1 Mod PT Treatments $Therapeutic Activity: 8-22 mins        Shiv Shuey, PT, GCS 11/26/19,4:53 PM

## 2019-11-26 NOTE — H&P (Signed)
The patient has been re-examined, and the chart reviewed, and there have been no interval changes to the documented history and physical.    The risks, benefits, and alternatives have been discussed at length. The patient expressed understanding of the risks benefits and agreed with plans for surgical intervention.  Dashay Giesler P. Ashish Rossetti, Jr. M.D.    

## 2019-11-27 ENCOUNTER — Encounter: Payer: Self-pay | Admitting: Orthopedic Surgery

## 2019-11-27 NOTE — Evaluation (Signed)
Occupational Therapy Evaluation Patient Details Name: Carolyn Frye MRN: [id number] DOB: [birth year] Today's Date: 11/27/2019    History of Present Illness 79 y/o female POD1 s/p L THA. PMH includes former smoker, HTN, dysrhythmias, GERD, renal disease   Clinical Impression   Pt seen for OT evaluation this date, POD#1 from above surgery. Pt was independent in all ADL prior to surgery, however recently needing occasional assist for donning L socks/shoes and using SPC for community ambulation due to L hip pain. She lives with her husband, daughter and grandson and enjoys working in her garden. Pt is eager to return to PLOF with less pain and improved safety and independence, though experiences some nausea with mobility this session. RN notified. Pt currently requires CGA and RW for transfer to [facility] and toileting hygiene with verbal cues throughout for safety and sequencing. Pt able to recall 2/3 posterior total hip precautions at start of session and unable to verbalize how to implement during ADL and mobility. Pt instructed in posterior total hip precautions and how to implement, self care skills, falls prevention strategies, home/routines modifications, DME/AE for LB bathing and dressing tasks and compression stocking mgt strategies. At end of session, pt able to recall 2/3 posterior total hip precautions. Pt would benefit from additional instruction in self care skills and techniques to help maintain precautions with or without assistive devices to support recall and carryover prior to discharge. Recommend HHOT upon discharge.        Follow Up Recommendations  Home health OT    Equipment Recommendations  3 in 1 bedside commode    Recommendations for Other Services       Precautions / Restrictions Precautions Precautions: Posterior Hip;Fall Precaution Booklet Issued: No Restrictions Weight Bearing Restrictions: Yes LLE Weight Bearing: Weight bearing as tolerated      Mobility Bed  Mobility Overal bed mobility: Needs Assistance Bed Mobility: Supine to Sit     Supine to sit: Supervision     General bed mobility comments: got to EOB with Mod I for increased time and bed rail use, supervision provided for safety throughout, indep managed LLE  Transfers Overall transfer level: Needs assistance Equipment used: Rolling walker (2 wheeled) Transfers: Sit to/from Stand Sit to Stand: Min guard         General transfer comment: cues for sit to stand positioning and hand placement, demonstrates good ability despite nausea    Balance Overall balance assessment: Needs assistance Sitting-balance support: Feet supported Sitting balance-Leahy Scale: Good Sitting balance - Comments: steady static sitting balance at EOB, no LOB noted   Standing balance support: Bilateral upper extremity supported;During functional activity Standing balance-Leahy Scale: Fair Standing balance comment: relies on RW for BUE support and Min Guard A during ambulation, maintained steady balance with one extremity supported on RW during pericare                           ADL either performed or assessed with clinical judgement   ADL Overall ADL's : Needs assistance/impaired Eating/Feeding: Set up;Sitting                       Toilet Transfer: Min guard;Cueing for safety;Cueing for sequencing;RW;BSC;Adhering to hip precautions Toilet Transfer Details (indicate cue type and reason): pt required close [employer] 2/2 nausea and consistent cues for sequencing and hip precautions, generally maintaining throughout Toileting- Water quality scientist and Hygiene: Min guard;Sit to/from stand;With adaptive equipment;Adhering to hip precautions Toileting -  Clothing Manipulation Details (indicate cue type and reason): using RW for BUE support     Functional mobility during ADLs: Min guard;Rolling walker;Cueing for sequencing;Cueing for safety General ADL Comments: Pt grossly able to  complete seated UB ADL with Setup A. Anticipate Min A and AE/DME for sit <> stand LBD and Mod A for LB bathing. Generally adheres well to posterior THP, does need reminders to maintain <90 degree flexion and consistent cueing for safety/sequencing.     Vision Baseline Vision/History: No visual deficits Patient Visual Report: No change from baseline Additional Comments: reports previous corrective cataract surgery     Perception     Praxis      Pertinent Vitals/Pain Pain Assessment: Faces Faces Pain Scale: Hurts a little bit Pain Location: L hip with movement Pain Descriptors / Indicators: Operative site guarding;Discomfort Pain Intervention(s): Limited activity within patient's tolerance;Monitored during session;Premedicated before session;Repositioned     Hand Dominance     Extremity/Trunk Assessment Upper Extremity Assessment Upper Extremity Assessment: Overall WFL for tasks assessed   Lower Extremity Assessment Lower Extremity Assessment: LLE deficits/detail LLE Deficits / Details: Anticipated post-op deficits in LLE strength/mobility   Cervical / Trunk Assessment Cervical / Trunk Assessment: Normal   Communication Communication Communication: HOH   Cognition Arousal/Alertness: Awake/alert Behavior During Therapy: WFL for tasks assessed/performed Overall Cognitive Status: Within Functional Limits for tasks assessed                                 General Comments: some deficits in safety awareness, possibly due to nausea during mobility; otherwise A&O throughout   General Comments  Pt became nauseated w/o vomiting during mobility, RN notified and cold cloth provided; pt reported improvement    Exercises Other Exercises Other Exercises: Pt educated re: the role of OT in recovery, posterior THP in the context of ADL/IADL, compression stocking mgt, safe AE/DME use for ADL and falls prevention strategies. Pt receptive and engaged throughout; handout  provided for carryover.   Shoulder Instructions      Home Living Family/patient expects to be discharged to:: Private residence Living Arrangements: Spouse/significant other;Children (daughter and 8th grade grandson) Available Help at Discharge: Family;Available 24 hours/day Type of Home: House Home Access: Stairs to enter [employer] of Steps: 4 Entrance Stairs-Rails: Right;Left Home Layout: Two level;Able to live on main level with bedroom/bathroom     Bathroom Shower/Tub: Tub/shower unit;Walk-in shower   Bathroom Toilet: Handicapped height     Home Equipment: [place] - single point;Walker - 2 wheels;Shower seat - built in;Hand held shower head   Additional Comments: "pretty sure" walker is 2WW      Prior Functioning/Environment Level of Independence: Independent with assistive device(s)        Comments: Pt ambulated community distances and occasional household distances with SPC in past few weeks 2/2 L hip pain. Previously indep in ADL and IADL mgt with no history of falls. She recently needed assist with donning L sock/shoes and putting on psoriasis ointment 2/2 L hip pain (husband assisted).        OT Problem List: Decreased strength;Decreased range of motion;Decreased activity tolerance;Impaired balance (sitting and/or standing);Decreased knowledge of precautions;Decreased knowledge of use of DME or AE;Decreased safety awareness;Pain      OT Treatment/Interventions: Self-care/ADL training;Therapeutic exercise;Therapeutic activities;DME and/or AE instruction;Patient/family education    OT Goals(Current goals can be found in the care plan section) Acute Rehab OT Goals Patient Stated Goal: To be able to  move without nausea OT Goal Formulation: With patient Time For Goal Achievement: 12/11/19 Potential to Achieve Goals: Good ADL Goals Pt Will Perform Lower Body Dressing: with modified independence;sit to/from stand;with adaptive equipment (using LRAD and  maintaining hip precautions without VCs) Pt Will Transfer to Toilet: with modified independence;ambulating;bedside commode;regular height toilet (using LRAD (BSC over toilet) while maintaining hip precautions without VCs) Additional ADL Goal #1: Pt will independently instruct caregiver on compression stocking mgt to maximize pt independence and reduce caregiver burden  OT Frequency: Min 2X/week   Barriers to D/C:            Co-evaluation              AM-PAC OT "6 Clicks" Daily Activity     Outcome Measure Help from another person eating meals?: None Help from another person taking care of personal grooming?: None Help from another person toileting, which includes using toliet, bedpan, or urinal?: A Little Help from another person bathing (including washing, rinsing, drying)?: A Lot Help from another person to put on and taking off regular upper body clothing?: None Help from another person to put on and taking off regular lower body clothing?: A Little 6 Click Score: 20   End of Session Equipment Utilized During Treatment: Gait belt;Rolling walker Nurse Communication: Mobility status;Other (comment) (pt nausea)  Activity Tolerance: Patient tolerated treatment well;Other (comment) (Pt did become nauseated with mobility, w/o vomiting) Patient left: in chair;with call bell/phone within reach;with chair alarm set  OT Visit Diagnosis: Other abnormalities of gait and mobility (R26.89);Pain Pain - Right/Left: Left Pain - part of body: Hip                Time: 2353-6144 OT Time Calculation (min): 54 min Charges:  OT General Charges $OT Visit: 1 Visit OT Evaluation $OT Eval Moderate Complexity: 1 Mod OT Treatments $Self Care/Home Management : 38-52 mins  Jerilynn Birkenhead, OTS 11/27/19, 9:52 AM

## 2019-11-27 NOTE — TOC Initial Note (Signed)
Transition of Care [facility]) - Initial/Assessment Note    Patient Details  Name: Carolyn Frye MRN: [id number] Date of Birth: Dec 02, 1940  [facility]) CM/SW Contact:    Su Hilt, RN Phone Number: 11/27/2019, 2:05 PM  Clinical Narrative:                 Spoke with the patient and her husband for DC planning and needs, She lives at home and has a rolling walker and needs a 3 in 1, I notified [place] with [employer] for the 3 in 1 and it will be brought into the room, The patient is set [facility] with [facility] services  I requested the price of lovenox and will provide it once obtained NO additional needs  Expected Discharge Plan: [place] Barriers to Discharge: Continued Medical Work up   Patient Goals and CMS Choice Patient states their goals for this hospitalization and ongoing recovery are:: go home      Expected Discharge Plan and Services Expected Discharge Plan: [place]   Discharge Planning Services: CM Consult   Living arrangements for the past 2 months: Single Family Home                 DME Arranged: 3-N-1 DME Agency: [employer] Date DME Agency Contacted: 11/27/19 Time DME Agency Contacted: [phone] Representative spoke with at DME Agency: [place]: PT [place]: [facility] [employer]) Date [place]: 11/27/19 Time [place]: [place] Representative spoke with at [place]: Helene Kelp  Prior Living Arrangements/Services Living arrangements for the past 2 months: [place] Lives with:: Spouse Patient language and need for interpreter reviewed:: Yes Do you feel safe going back to the place where you live?: Yes      Need for Family Participation in Patient Care: No (Comment) Care giver support system in place?: Yes (comment) Current home services: DME (rolling walker) Criminal Activity/Legal Involvement Pertinent to Current Situation/Hospitalization: No -  Comment as needed  Activities of Daily Living   ADL Screening (condition at time of admission) Patient's cognitive ability adequate to safely complete daily activities?: Yes Independently performs ADLs?: Yes (appropriate for developmental age)  Permission Sought/Granted   Permission granted to share information with : Yes, Verbal Permission Granted              Emotional Assessment Appearance:: Appears stated age Attitude/Demeanor/Rapport: Engaged Affect (typically observed): Appropriate Orientation: : Oriented to Situation, Oriented to  Time, Oriented to Place, Oriented to Self Alcohol / Substance Use: Not Applicable Psych Involvement: No (comment)  Admission diagnosis:  H/O total hip arthroplasty [Z96.649] Patient Active Problem List   Diagnosis Date Noted  . 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/26/2019  . Renal cyst 10/09/2019  . 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/28/2019  . Barrett's esophagus 09/05/2018  . Essential hypertension 09/05/2018  . History of renal carcinoma 09/05/2018  . Osteoporosis, 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  . Pure hypercholesterolemia 09/05/2018  . Vitamin D deficiency 09/05/2018  . Psoriasis 03/12/2018  . Vaccine counseling 07/09/2017  . Recurrent UTI 06/06/2017  . Personal history of malignant neoplasm of kidney 06/06/2017  . Microhematuria 06/06/2017  . Chest pain 09/21/2016  . Encounter for general adult medical examination without abnormal findings 09/04/2016  . History of melanoma 08/28/2014  . Melanoma ([place]) 04/28/2013   PCP:  Dion Body, MD Pharmacy:   [id number] [place], [place] [address]  [place] [address] Phone: [phone] Fax: [phone]     Social Determinants of Health (SDOH) Interventions    Readmission Risk Interventions No flowsheet data found.

## 2019-11-27 NOTE — TOC Benefit Eligibility Note (Signed)
Transition of Care [facility]) Benefit Eligibility Note    Patient Details  Name: Carolyn Frye MRN: [id number] Date of Birth: 06/08/1941   Medication/Dose: ENOXAPARIN 40 MG DAILY X 14 DAYS SYRINGES  Covered?: Yes  Tier:  (TIER- 4 DRUG)  Prescription Coverage [facility]  Spoke with Person/Company/Phone Number:: MICHELL   @  [employer] # [phone]  Co-Pay: $47.00  Prior Approval: Yes [phone]   OVER Q/L)  Deductible: Unmet  Additional Notes: LOVENOX 40 MG DAILY : NOT COVER P/A -YES #  [phone]    Memory Argue Phone Number: 11/27/2019, 3:24 PM

## 2019-11-27 NOTE — Progress Notes (Signed)
°  Subjective: 1 Day Post-Op Procedure(s) (LRB): TOTAL HIP ARTHROPLASTY (Left) Patient reports pain as well-controlled.   Patient is well, and has had no acute complaints or problems Plan is to go Home after hospital stay. Negative for chest pain and shortness of breath Fever: no Gastrointestinal: negative for nausea and vomiting today, but did havesignificant nausea yesterday.   Patient has had a bowel movement.  Objective: Vital signs in last 24 hours: Temp:  [96.8 F (36 C)-98.2 F (36.8 C)] 98.2 F (36.8 C) (06/10 0752) Pulse Rate:  [63-77] 77 (06/10 0752) Resp:  [10-20] 17 (06/10 0752) BP: (98-131)/(55-82) 126/82 (06/10 0752) SpO2:  [89 %-100 %] 98 % (06/10 0752)  Intake/Output from previous day:  Intake/Output Summary (Last 24 hours) at 11/27/2019 0840 Last data filed at 11/27/2019 0600 Gross per 24 hour  Intake 3412.72 ml  Output 785 ml  Net 2627.72 ml    Intake/Output this shift: No intake/output data recorded.  Labs: No results for input(s): HGB in the last 72 hours. No results for input(s): WBC, RBC, HCT, PLT in the last 72 hours. No results for input(s): NA, K, CL, CO2, BUN, CREATININE, GLUCOSE, CALCIUM in the last 72 hours. No results for input(s): LABPT, INR in the last 72 hours.   EXAM General - Patient is Alert, Appropriate and Oriented Extremity - Neurovascular intact Dorsiflexion/Plantar flexion intact Compartment soft Dressing/Incision -clean, dry, no drainage, Hemovac in place.  Motor Function - intact, moving foot and toes well on exam.  Cardiovascular- Regular rate and rhythm, no murmurs/rubs/gallops Respiratory- Lungs clear to auscultation bilaterally Gastrointestinal- soft, nontender and active bowel sounds   Assessment/Plan: 1 Day Post-Op Procedure(s) (LRB): TOTAL HIP ARTHROPLASTY (Left) Active Problems:   H/O total hip arthroplasty  Estimated body mass index is 22.92 kg/m as calculated from the following:   Height as of this  encounter: 5\' 6"  (1.676 m).   Weight as of this encounter: 64.4 kg. Advance diet Up with therapy Plan for discharge tomorrow May hold Milk of Magnesia this AM.    DVT Prophylaxis - Lovenox, Ted hose and foot pumps Weight-Bearing as tolerated to left leg  Cassell Smiles, PA-C [facility] Orthopaedic Surgery 11/27/2019, 8:40 AM

## 2019-11-27 NOTE — Progress Notes (Signed)
Physical Therapy Treatment Patient Details Name: Carolyn Frye MRN: [id number] DOB: 06/06/1941 Today's Date: 11/27/2019    History of Present Illness 79 y/o female POD1 s/p L THA. PMH includes former smoker, HTN, dysrhythmias, GERD, renal disease    PT Comments    Pt was in recliner upon arriving. She endorsed more severe pain this afternoon versus morning session. Agrees to trial ambulation and stairs however once standing in wt bearing c/o severe pain requesting to hold on further PT. Therapist will return in morning for stair training and continue progressing pt towards goals. She was able to ambulate 200 ft in AM session and is safe to DC to home from PT standpoint once safe stairs navigation is accomplished. Overall pt is progressing well. RN aware of pain med requested. Pt was repositioned in supine with bed alarm in place,ice pack applied and call bell in reach.    Follow Up Recommendations  Home health PT     Equipment Recommendations  3in1 (PT)    Recommendations for Other Services       Precautions / Restrictions Precautions Precautions: Posterior Hip;Fall Precaution Booklet Issued: Yes (comment) Restrictions Weight Bearing Restrictions: Yes LLE Weight Bearing: Weight bearing as tolerated    Mobility  Bed Mobility Overal bed mobility: Needs Assistance Bed Mobility: Sit to Supine       Sit to supine: Min guard   General bed mobility comments: Increased time to perform 2/2 to pain. CGA for safety and adhere to precautions  Transfers Overall transfer level: Needs assistance Equipment used: Rolling walker (2 wheeled) Transfers: Sit to/from Stand Sit to Stand: Supervision         General transfer comment: pt was able to stand form recliner with supervision. once in wt bearing, pt has severe pain and was unwilling to trial ambulation into hallway. RN notified and issued pain meds.  Ambulation/Gait Ambulation/Gait assistance: Min guard   Assistive  device: Rolling walker (2 wheeled) Gait Pattern/deviations: Step-through pattern Gait velocity: WNL   General Gait Details: Pt was able to safely ambulate from recliner to bed   Stairs             Wheelchair Mobility    Modified Rankin (Stroke Patients Only)       Balance Overall balance assessment: Modified Independent         Standing balance support: Bilateral upper extremity supported;During functional activity Standing balance-Leahy Scale: Good Standing balance comment: no LOB with BUE support. was able to adjust clothing and donn mask in static standing without assitance                            Cognition Arousal/Alertness: Awake/alert Behavior During Therapy: WFL for tasks assessed/performed Overall Cognitive Status: Within Functional Limits for tasks assessed                                 General Comments: Pt was A and O and agreeable to PT session. does endorse increased pain this afternoon versus  AM session      Exercises      General Comments        Pertinent Vitals/Pain Pain Assessment: 0-10 Pain Score: 8  Faces Pain Scale: Hurts even more Pain Location: L hip with movement Pain Descriptors / Indicators: Operative site guarding;Discomfort Pain Intervention(s): Limited activity within patient's tolerance;Monitored during session;Premedicated before session    Home Living  Prior Function            PT Goals (current goals can now be found in the care plan section) Acute Rehab PT Goals Patient Stated Goal: To be able to safely go home    Frequency    BID      PT Plan Current plan remains appropriate    Co-evaluation              AM-PAC PT "6 Clicks" Mobility   Outcome Measure  Help needed turning from your back to your side while in a flat bed without using bedrails?: A Little Help needed moving from lying on your back to sitting on the side of a flat bed  without using bedrails?: A Little Help needed moving to and from a bed to a chair (including a wheelchair)?: A Little Help needed standing up from a chair using your arms (e.g., wheelchair or bedside chair)?: A Little Help needed to walk in hospital room?: A Little Help needed climbing 3-5 steps with a railing? : A Little 6 Click Score: 18    End of Session Equipment Utilized During Treatment: Gait belt Activity Tolerance: Patient tolerated treatment well Patient left: in bed;with call bell/phone within reach;with bed alarm set;with family/visitor present Nurse Communication: Mobility status PT Visit Diagnosis: Unsteadiness on feet (R26.81);Other abnormalities of gait and mobility (R26.89);Muscle weakness (generalized) (M62.81);Pain;Difficulty in walking, not elsewhere classified (R26.2) Pain - Right/Left: Left Pain - part of body: Hip     Time:  -     Charges:                        Julaine Fusi PTA 11/27/19, 4:52 PM

## 2019-11-27 NOTE — Discharge Summary (Signed)
Physician Discharge Summary  Patient ID: Carolyn Frye MRN: [id number] DOB/AGE: 12/13/1940 79 y.o.  Admit date: 11/26/2019 Discharge date: 11/28/2019  Admission Diagnoses:  H/O total hip arthroplasty [Z96.649]  Surgeries:   Left total hip arthroplasty  SURGEON:  Marciano Sequin. M.D.  ASSISTANT: Cassell Smiles, PA-C (present and scrubbed throughout the case, critical for assistance with exposure, retraction, instrumentation, and closure)  ANESTHESIA: spinal  ESTIMATED BLOOD LOSS: 100 mL  FLUIDS REPLACED: 1600 mL of crystalloid  DRAINS: 2 medium drains to a Hemovac reservoir  IMPLANTS UTILIZED: DePuy 15 mm large stature AML femoral stem, 56 mm OD Pinnacle 100 acetabular component, +4 mm neutral Pinnacle Marathon polyethylene insert, and a 36 mm M-SPEC +5 mm hip ball  Discharge Diagnoses: Patient Active Problem List   Diagnosis Date Noted  . 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/26/2019  . Renal cyst 10/09/2019  . 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/28/2019  . Barrett's esophagus 09/05/2018  . Essential hypertension 09/05/2018  . History of renal carcinoma 09/05/2018  . Osteoporosis, 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  . Pure hypercholesterolemia 09/05/2018  . Vitamin D deficiency 09/05/2018  . Psoriasis 03/12/2018  . Vaccine counseling 07/09/2017  . Recurrent UTI 06/06/2017  . Personal history of malignant neoplasm of kidney 06/06/2017  . Microhematuria 06/06/2017  . Chest pain 09/21/2016  . Encounter for general adult medical examination without abnormal findings 09/04/2016  . History of melanoma 08/28/2014  . Melanoma ([place]) 04/28/2013    Past Medical History:  Diagnosis Date  . Arthritis    hands, feet  . Cancer [facility]) 2007   kidney right   . Cancer ([place]) 2014   melanoma  . Cellulitis    face  . Chronic kidney disease    kidney cancer  . Diverticulosis   . Dysrhythmia    several years ago  . GERD (gastroesophageal reflux disease)   . History of kidney  stones   . Hypercholesteremia   . Hypertension   . MRSA (methicillin resistant Staphylococcus aureus) 03/03/2009   foot wound - treated  . Osteoporosis   . Seasonal allergies   . Wears contact lenses   . Wears dentures    partial bottom     Transfusion: n/a   Consultants (if any):   Discharged Condition: Improved  Hospital Course: Carolyn Frye is an 79 y.o. female who was admitted 11/26/2019 with a diagnosis of left hip osteoarthritis and went to the operating room on 11/26/2019 and underwent left total hip arthroplasty through posterior approach. The patient received perioperative antibiotics for prophylaxis (see below). The patient tolerated the procedure well and was transported to PACU in stable condition. After meeting PACU criteria, the patient was subsequently transferred to the Orthopaedics/Rehabilitation unit.   The patient received DVT prophylaxis in the form of early mobilization, Lovenox. A sacral pad had been placed and heels were elevated off of the bed with rolled towels in order to protect skin integrity. Foley catheter was discontinued on postoperative day #0. Wound drains were discontinued on postoperative day #2. The surgical incision was healing well without signs of infection.  Physical therapy was initiated postoperatively for transfers, gait training, and strengthening. Occupational therapy was initiated for activities of daily living and evaluation for assisted devices. Rehabilitation goals were reviewed in detail with the patient. The patient made steady progress with physical therapy and physical therapy recommended discharge to Home.   The patient achieved the preliminary goals of this hospitalization and was felt to be medically and orthopaedically appropriate for discharge.  She was given perioperative antibiotics:  Anti-infectives (From admission, onward)   Start     Dose/Rate Route Frequency Ordered Stop   11/26/19 1300  ceFAZolin (ANCEF) IVPB 2g/100 mL  premix        2 g 200 mL/hr over 30 Minutes Intravenous Every 6 hours 11/26/19 1216 11/27/19 0849   11/26/19 0622  ceFAZolin (ANCEF) 2-4 GM/100ML-% IVPB       Note to Pharmacy: Myles Lipps   : cabinet override      11/26/19 0622 11/26/19 0732   11/26/19 0615  ceFAZolin (ANCEF) IVPB 2g/100 mL premix        2 g 200 mL/hr over 30 Minutes Intravenous On call to O.R. 11/26/19 4496 11/26/19 0800    .  Recent vital signs:  Vitals:   11/27/19 2211 11/28/19 0731  BP: (!) 141/69 (!) 146/76  Pulse: 81 88  Resp: 16 20  Temp: 99.3 F (37.4 C) 99.3 F (37.4 C)  SpO2: 96% 93%    Recent laboratory studies:  No results for input(s): WBC, HGB, HCT, PLT, K, CL, CO2, BUN, CREATININE, GLUCOSE, CALCIUM, LABPT, INR in the last 72 hours.  Diagnostic Studies: DG Hip Port Unilat With Pelvis 1V Left  Result Date: 11/26/2019 CLINICAL DATA:  Status post left hip replacement. EXAM: DG HIP (WITH OR WITHOUT PELVIS) 1V PORT LEFT COMPARISON:  None. FINDINGS: Post left hip arthroplasty. No periprosthetic lucency or fracture. Recent postsurgical change includes air and edema in the joint space and soft tissues, surgical drain, and skin staples in place. IMPRESSION: Post left hip arthroplasty without immediate postoperative complication. Electronically Signed   By: Keith Rake M.D.   On: 11/26/2019 14:08    Discharge Medications:   Allergies as of 11/28/2019      Reactions   Ciprofloxacin Other (See Comments)   Shoulder pain, tendon tear   Sulfa Antibiotics Other (See Comments)   unknown   Atorvastatin Other (See Comments)   Joint aches   Dilaudid [hydromorphone] Other (See Comments)   Severe headache   Hydralazine Palpitations   Levaquin [levofloxacin] Anxiety   Macrobid [nitrofurantoin] Other (See Comments)   Red, burning eyes      Medication List    TAKE these medications   acetaminophen 500 MG tablet Commonly known as: TYLENOL Take 1,000 mg by mouth every 6 (six) hours as needed for  moderate pain.   amLODipine 10 MG tablet Commonly known as: NORVASC Take 10 mg by mouth daily.   cholecalciferol 25 MCG (1000 UNIT) tablet Commonly known as: VITAMIN D3 Take 1,000 Units by mouth daily.   clobetasol ointment 0.05 % Commonly known as: TEMOVATE Apply 1 application topically 2 (two) times daily.   enoxaparin 40 MG/0.4ML injection Commonly known as: LOVENOX Inject 0.4 mLs (40 mg total) into the skin daily for 14 days.   hydrochlorothiazide 12.5 MG tablet Commonly known as: HYDRODIURIL Take 12.5 mg by mouth daily.   labetalol 100 MG tablet Commonly known as: NORMODYNE Take 100 mg by mouth 2 (two) times daily.   omeprazole 20 MG capsule Commonly known as: PRILOSEC Take 20 mg by mouth daily.   oxyCODONE 5 MG immediate release tablet Commonly known as: Oxy IR/ROXICODONE Take 1 tablet (5 mg total) by mouth every 4 (four) hours as needed for moderate pain (pain score 4-6).   rosuvastatin 10 MG tablet Commonly known as: CRESTOR Take 10 mg by mouth daily. AM   traMADol 50 MG tablet Commonly known as: ULTRAM Take 1 tablet (50 mg total)  by mouth every 4 (four) hours as needed for moderate pain.            Durable Medical Equipment  (From admission, onward)         Start     Ordered   11/26/19 1217  DME Walker rolling  Once       Question:  Patient needs a walker to treat with the following condition  Answer:  S/P total hip arthroplasty   11/26/19 1216   11/26/19 1217  DME Bedside commode  Once       Question:  Patient needs a bedside commode to treat with the following condition  Answer:  S/P total hip arthroplasty   11/26/19 1216          Disposition: home with home health PT      Follow-up Information    Dereck Leep, MD On 01/08/2020.   Specialty: Orthopedic Surgery Why: at 2:30pm Contact information: [address] [id number] [place], PA-C 11/28/2019,  10:48 AM

## 2019-11-27 NOTE — Anesthesia Postprocedure Evaluation (Signed)
Anesthesia Post Note  Patient: Carolyn Frye  Procedure(s) Performed: TOTAL HIP ARTHROPLASTY (Left Hip)  Patient location during evaluation: Nursing Unit Anesthesia Type: Spinal Level of consciousness: awake and awake and alert Pain management: pain level controlled Vital Signs Assessment: post-procedure vital signs reviewed and stable Respiratory status: spontaneous breathing Postop Assessment: no headache Anesthetic complications: no   No complications documented.   Last Vitals:  Vitals:   11/27/19 0406 11/27/19 0752  BP: (!) 107/57 126/82  Pulse: 66 77  Resp: 16 17  Temp: 36.7 C 36.8 C  SpO2: 96% 98%    Last Pain:  Vitals:   11/27/19 0007  TempSrc: Oral  PainSc:                  Lerry Liner

## 2019-11-27 NOTE — Progress Notes (Addendum)
Physical Therapy Treatment Patient Details Name: Carolyn Frye MRN: [id number] DOB: 1941-01-07 Today's Date: 11/27/2019    History of Present Illness 79 y/o female POD1 s/p L THA. PMH includes former smoker, HTN, dysrhythmias, GERD, renal disease    PT Comments    Pt was seated in recliner upon arriving. She agrees to PT session and reports nausea much improved this date. Reviewed precautions and pt states understanding.  She was able to stand and ambulate 200 ft with with RW + supervision only. No LOB or unsteadiness. She requested to perform stair training in PM session. Therapist issued HEP exercise and pt performed AROM throughout. She was seated in recliner with call bell in reach and ice pack applied. She is progressing well towards PT goals and will benefit from HHPT at DC to address strength and mobility deficits while assist pt to PLOF. RN aware of pt's abilities.    Follow Up Recommendations  Home health PT     Equipment Recommendations  3in1 (PT)    Recommendations for Other Services       Precautions / Restrictions Precautions Precautions: Posterior Hip;Fall Precaution Booklet Issued: Yes (comment)    Mobility  Bed Mobility               General bed mobility comments: pt was seated in recliner pre/post session.   Transfers Overall transfer level: Needs assistance Equipment used: Rolling walker (2 wheeled) Transfers: Sit to/from Stand Sit to Stand: Supervision         General transfer comment: Pt was able to STS from recliner with vcs only for handplacement and adhereing to precautions. no physical lifting assistance required.  Ambulation/Gait Ambulation/Gait assistance: Min guard   Assistive device: Rolling walker (2 wheeled) Gait Pattern/deviations: Step-through pattern Gait velocity: WNL   General Gait Details: pt demonstrated safe steady gait kinematics. no LOB or difficulty with ambulation in hallway.   Stairs              Wheelchair Mobility    Modified Rankin (Stroke Patients Only)       Balance Overall balance assessment: Modified Independent         Standing balance support: Bilateral upper extremity supported;During functional activity Standing balance-Leahy Scale: Good Standing balance comment: no LOB with BUE support. was able to adjust clothing and donn mask in static standing without assitance                            Cognition Arousal/Alertness: Awake/alert Behavior During Therapy: WFL for tasks assessed/performed Overall Cognitive Status: Within Functional Limits for tasks assessed                                 General Comments: Pt si A and O x 4 and agreeable to PT session. reports overall feeling better this date. motivated and cooperative throughout      Exercises      General Comments        Pertinent Vitals/Pain Faces Pain Scale: Hurts a little bit Pain Location: L hip with movement Pain Descriptors / Indicators: Operative site guarding;Discomfort    Home Living                      Prior Function            PT Goals (current goals can now be found in the care plan section)  Acute Rehab PT Goals Patient Stated Goal: To be able to move without nausea    Frequency    BID      PT Plan Current plan remains appropriate    Co-evaluation              AM-PAC PT "6 Clicks" Mobility   Outcome Measure  Help needed turning from your back to your side while in a flat bed without using bedrails?: A Little Help needed moving from lying on your back to sitting on the side of a flat bed without using bedrails?: A Little Help needed moving to and from a bed to a chair (including a wheelchair)?: A Little Help needed standing up from a chair using your arms (e.g., wheelchair or bedside chair)?: A Little Help needed to walk in hospital room?: A Little Help needed climbing 3-5 steps with a railing? : A Little 6 Click Score:  18    End of Session Equipment Utilized During Treatment: Gait belt Activity Tolerance: Patient tolerated treatment well Patient left: in chair;with chair alarm set;with call bell/phone within reach;with nursing/sitter in room;with SCD's reapplied Nurse Communication: Mobility status PT Visit Diagnosis: Unsteadiness on feet (R26.81);Other abnormalities of gait and mobility (R26.89);Muscle weakness (generalized) (M62.81);Pain;Difficulty in walking, not elsewhere classified (R26.2) Pain - Right/Left: Left Pain - part of body: Hip     Time:  -     Charges:                        Julaine Fusi PTA 11/27/19, 4:37 PM

## 2019-11-27 NOTE — Plan of Care (Signed)

## 2019-11-28 LAB — SURGICAL PATHOLOGY

## 2019-11-28 MED ORDER — TRAMADOL HCL 50 MG PO TABS: 50.0000 mg | ORAL_TABLET | ORAL | 0 refills | Status: AC | PRN

## 2019-11-28 MED ORDER — OXYCODONE HCL 5 MG PO TABS: 5.0000 mg | ORAL_TABLET | ORAL | 0 refills | Status: AC | PRN

## 2019-11-28 MED ORDER — ENOXAPARIN SODIUM 40 MG/0.4ML ~~LOC~~ SOLN
40.0000 mg | SUBCUTANEOUS | 0 refills | Status: AC
Start: 2019-11-28 — End: 2023-11-29

## 2019-11-28 NOTE — Progress Notes (Signed)
Physical Therapy Treatment Patient Details Name: Carolyn Frye MRN: [id number] DOB: 09/15/1940 Today's Date: 11/28/2019    History of Present Illness 79 y/o female POD1 s/p L THA. PMH includes former smoker, HTN, dysrhythmias, GERD, renal disease    PT Comments    Pt was seated in recliner upon arriving. She agrees to PT session and is cooperative and pleasant throughout. Pt is planning to DC home this date and states she feels confident/safe with upcoming DC. Reports pain is much better today. She was able to adhere to hip precautions throughout session. Demonstrated safe ability to ambulate 200 ft and perform ascending/descending 6 stair with +1 UE support using step to pattern. Once returned to room, pt perform HEP handout I'ly with minimal cues for improved technique. Overall pt is progressing well towards PT goals and will benefit from skilled HHPT to address deficits and assist pt to returning to PLOF. PT was seated up in recliner, call bell in reach, ice pack applied, and chair alarm in place at conclusion of session.     Follow Up Recommendations  Home health PT     Equipment Recommendations  3in1 (PT)    Recommendations for Other Services       Precautions / Restrictions Precautions Precautions: Posterior Hip;Fall Precaution Booklet Issued: Yes (comment) Restrictions Weight Bearing Restrictions: Yes LLE Weight Bearing: Weight bearing as tolerated    Mobility  Bed Mobility               General bed mobility comments: pt was in recliner pre/post session but pt has demonstrated safe ability to get in/out of bed without assistance  Transfers Overall transfer level: Modified independent Equipment used: Rolling walker (2 wheeled) Transfers: Sit to/from Stand Sit to Stand: Modified independent (Device/Increase time)         General transfer comment: Pt demonstrated safe ability to transfer while adhering to hip  precautions  Ambulation/Gait Ambulation/Gait assistance: Supervision Gait Distance (Feet): 200 Feet Assistive device: Rolling walker (2 wheeled)   Gait velocity: WNL   General Gait Details: pt started ambulation with step to pattern but was able to progress to step through pattern by the end of gait training   Stairs Stairs: Yes Stairs assistance: Min guard Stair Management: One rail Left;Step to pattern Number of Stairs: 6 General stair comments: pt demonstrated safe ability to ascend/descend 6 stair with + 1 rail. pt's spouse will assist at home. pt states confidence in abilities to safely enter/exit home entry   Wheelchair Mobility    Modified Rankin (Stroke Patients Only)       Balance Overall balance assessment: Modified Independent Sitting-balance support: Feet supported Sitting balance-Leahy Scale: Good     Standing balance support: Bilateral upper extremity supported;During functional activity Standing balance-Leahy Scale: Good                              Cognition Arousal/Alertness: Awake/alert Behavior During Therapy: WFL for tasks assessed/performed Overall Cognitive Status: Within Functional Limits for tasks assessed                                 General Comments: Pt is A and O x 4 and agreeable and cooperative throughout      Exercises Total Joint Exercises Ankle Circles/Pumps: AROM;15 reps Quad Sets: AROM;10 reps Gluteal Sets: AROM;10 reps Towel Squeeze: AROM;10 reps Short Arc Quad: AROM;10 reps Heel Slides:  (  did not perform 2/2 to being in chair and hip precauitions) Hip ABduction/ADduction: AROM;10 reps    General Comments        Pertinent Vitals/Pain Pain Assessment: 0-10 Pain Score: 3  Faces Pain Scale: Hurts a little bit Pain Location: L hip with movement Pain Descriptors / Indicators: Operative site guarding;Discomfort Pain Intervention(s): Limited activity within patient's tolerance;Monitored during  session;Premedicated before session;Repositioned;Ice applied    Home Living                      Prior Function            PT Goals (current goals can now be found in the care plan section) Acute Rehab PT Goals Patient Stated Goal: to return to PLOF Progress towards PT goals: Progressing toward goals    Frequency    BID      PT Plan Current plan remains appropriate    Co-evaluation     PT goals addressed during session: Proper use of DME;Strengthening/ROM;Balance;Mobility/safety with mobility        AM-PAC PT "6 Clicks" Mobility   Outcome Measure  Help needed turning from your back to your side while in a flat bed without using bedrails?: A Little Help needed moving from lying on your back to sitting on the side of a flat bed without using bedrails?: A Little Help needed moving to and from a bed to a chair (including a wheelchair)?: A Little Help needed standing up from a chair using your arms (e.g., wheelchair or bedside chair)?: A Little Help needed to walk in hospital room?: A Little Help needed climbing 3-5 steps with a railing? : A Little 6 Click Score: 18    End of Session Equipment Utilized During Treatment: Gait belt Activity Tolerance: Patient tolerated treatment well Patient left: in chair;with call bell/phone within reach;with chair alarm set Nurse Communication: Mobility status PT Visit Diagnosis: Unsteadiness on feet (R26.81);Other abnormalities of gait and mobility (R26.89);Muscle weakness (generalized) (M62.81);Pain;Difficulty in walking, not elsewhere classified (R26.2) Pain - Right/Left: Left Pain - part of body: Hip     Time: 0355-9741 PT Time Calculation (min) (ACUTE ONLY): 16 min  Charges:  $Gait Training: 8-22 mins                     Julaine Fusi PTA 11/28/19, 10:04 AM

## 2019-11-28 NOTE — TOC Transition Note (Signed)
Transition of Care [facility]) - CM/SW Discharge Note   Patient Details  Name: [name] MRN: [id number] Date of Birth: 11-Sep-1940  Transition of [facility]) CM/SW Contact:  Su Hilt, RN Phone Number: 11/28/2019, 10:55 AM   Clinical Narrative:    Met with the patient to ensure all needs are met before DC home with Home health thru [facility], she has the 3 in 1 to take home, other DME at home, I provided the price for Lovenox from her Insurance co to be $47, she is in agreeance and can afford it, no additional needs   Final next level of care: Home w Home Health Services Barriers to Discharge: Barriers Resolved   Patient Goals and CMS Choice Patient states their goals for this hospitalization and ongoing recovery are:: go home      Discharge Placement                       Discharge Plan and Services   Discharge Planning Services: CM Consult            DME Arranged: 3-N-1 DME Agency: [employer] Date DME Agency Contacted: 11/27/19 Time DME Agency Contacted: [phone] Representative spoke with at DME Agency: [place]: PT [place]: [facility] [employer]) Date [place]: 11/27/19 Time [place]: [place] Representative spoke with at [place]: [place] ([place]) Interventions     Readmission Risk Interventions No flowsheet data found.

## 2019-11-28 NOTE — Plan of Care (Signed)

## 2019-11-28 NOTE — Progress Notes (Signed)
  Subjective: 2 Days Post-Op Procedure(s) (LRB): TOTAL HIP ARTHROPLASTY (Left) Patient reports pain as moderate.   Patient is well, and has had no acute complaints or problems Plan is to go Home after hospital stay. Negative for chest pain and shortness of breath Fever: no Gastrointestinal: negative for nausea and vomiting.   Patient has had a bowel movement.  Objective: Vital signs in last 24 hours: Temp:  [98.2 F (36.8 C)-99.3 F (37.4 C)] 99.3 F (37.4 C) (06/11 0731) Pulse Rate:  [79-88] 88 (06/11 0731) Resp:  [16-20] 20 (06/11 0731) BP: (134-155)/(67-77) 146/76 (06/11 0731) SpO2:  [93 %-98 %] 93 % (06/11 0731)  Intake/Output from previous day:  Intake/Output Summary (Last 24 hours) at 11/28/2019 1046 Last data filed at 11/28/2019 1017 Gross per 24 hour  Intake 320 ml  Output 330 ml  Net -10 ml    Intake/Output this shift: Total I/O In: 320 [P.O.:320] Out: 250 [Urine:250]  Labs: No results for input(s): HGB in the last 72 hours. No results for input(s): WBC, RBC, HCT, PLT in the last 72 hours. No results for input(s): NA, K, CL, CO2, BUN, CREATININE, GLUCOSE, CALCIUM in the last 72 hours. No results for input(s): LABPT, INR in the last 72 hours.   EXAM General - Patient is Alert, Appropriate and Oriented Extremity - Neurovascular intact Dorsiflexion/Plantar flexion intact Compartment soft Dressing/Incision -clean, dry, no drainage, Hemovac in place.  Motor Function - intact, moving foot and toes well on exam.  Cardiovascular- Regular rate and rhythm, no murmurs/rubs/gallops Respiratory- Lungs clear to auscultation bilaterally Gastrointestinal- soft, nontender and active bowel sounds   Assessment/Plan: 2 Days Post-Op Procedure(s) (LRB): TOTAL HIP ARTHROPLASTY (Left) Active Problems:   H/O total hip arthroplasty  Estimated body mass index is 22.92 kg/m as calculated from the following:   Height as of this encounter: 5\' 6"  (1.676 m).   Weight as of this  encounter: 64.4 kg. Advance diet Up with therapy Discharge home with home health pending completion of therapy goals.   Hemovac removed.   DVT Prophylaxis - Lovenox, Ted hose and foot pumps Weight-Bearing as tolerated to left leg  Cassell Smiles, PA-C [facility] Orthopaedic Surgery 11/28/2019, 10:46 AM

## 2019-11-28 NOTE — Care Management Important Message (Signed)
Important Message  Patient Details  Name: Carolyn Frye MRN: [id number] Date of Birth: 06-22-1940   Medicare Important Message Given:  N/A - LOS <3 / Initial given by admissions     Juliann Pulse A Griselda Tosh 11/28/2019, 7:49 AM

## 2019-11-28 NOTE — Progress Notes (Signed)
Pt d/c home with husband this afternoon.  D/c paperwork was reviewed with pt and she expressed understanding.  Pt sent home with two extra honeycomb dressings.  IV removed from R forearm without issue.  NAD noted at d/c.  All belongings were packed up and taken by husband to car.  This nurse wheeled pt down to med mall and assisted her into her car.  BSC and personal cane were loaded into car.

## 2019-11-28 NOTE — Progress Notes (Signed)
Occupational Therapy Treatment Patient Details Name: Carolyn Frye MRN: [id number] DOB: Jul 01, 1940 Today's Date: 11/28/2019    History of present illness 79 y/o female POD2 s/p L THA. PMH includes former smoker, HTN, dysrhythmias, GERD, renal disease   OT comments  Carolyn Frye presents sitting up in the recliner this morning following PT session. She reported decreased nausea and independently recalled 3/3 posterior THP. Pt completed seated UBD independently and successfully completed sit to stand LBD using a reacher and RW with Supervision and VCs for education throughout. Her husband arrived in preparation for DC and pt ambulated with initial CGA, decreasing to Supervision, to the bathroom. She completed toileting and hygiene with Supervision, using RW. Pt adhered to posterior THP throughout with 2-3 reminders. Pt educated re: use of DME/AE for increased independence in ADL, caregiver education, home setup modifications and posterior THP. Pt and husband receptive and engaged throughout. Given pt functional performance, education and goals met and caregiver support, no further acute OT needs. Continue to recommend [facility] upon discharge to maximize pt outcomes and reduce caregiver burden.      Follow Up Recommendations  Home health OT    Equipment Recommendations  3 in 1 bedside commode (in pt room)    Recommendations for Other Services      Precautions / Restrictions Precautions Precautions: Posterior Hip;Fall Precaution Booklet Issued: Yes (comment) Restrictions Weight Bearing Restrictions: Yes LLE Weight Bearing: Weight bearing as tolerated       Mobility Bed Mobility               General bed mobility comments: unobserved, in recliner pre/post session  Transfers Overall transfer level: Needs assistance Equipment used: Rolling walker (2 wheeled) Transfers: Sit to/from Stand Sit to Stand: Supervision         General transfer comment: Supervision provided  throughout, pt demonstrated safe ability to transfer from recliner and BSC while adhering to hip precautions    Balance Overall balance assessment: Modified Independent Sitting-balance support: Feet supported;No upper extremity supported Sitting balance-Leahy Scale: Good Sitting balance - Comments: steady static and dynamic sitting balance in recliner, minor weight shifting limitations 2/2 L hip pain   Standing balance support: No upper extremity supported;During functional activity Standing balance-Leahy Scale: Fair Standing balance comment: no unsteadiness or LOB with BUE unsupported during static standing and adjusting clothing, limited unsupported weight shifting 2/2 L hip pain                           ADL either performed or assessed with clinical judgement   ADL Overall ADL's : Needs assistance/impaired                 Upper Body Dressing : Independent;Standing Upper Body Dressing Details (indicate cue type and reason): bra and blouse Lower Body Dressing: Set up;Cueing for safety;Cueing for sequencing;Adhering to hip precautions;Sit to/from stand;With adaptive equipment Lower Body Dressing Details (indicate cue type and reason): used reacher to complete LBD with Setup A, Min VCs provided for education and sequencing, static standing with BUE unsupported while adjusting clothing Toilet Transfer: Min Network engineer;Adhering to hip precautions;RW;Ambulation Toilet Transfer Details (indicate cue type and reason): pt intially Min Guard with RW and progressed to Supervision, adhered to hip precautions with 1 verbal cue; BSC over toilet Toileting- Clothing Manipulation and Hygiene: Supervision/safety;Sit to/from stand;With adaptive equipment;Adhering to hip precautions Toileting - Clothing Manipulation Details (indicate cue type and reason): used RW for single UE support  Functional mobility during ADLs: Supervision/safety;Min guard;Rolling  walker General ADL Comments: Generally adheres well to posterior THP without reminders during ADL     Vision Baseline Vision/History: No visual deficits Patient Visual Report: No change from baseline     Perception     Praxis      Cognition Arousal/Alertness: Awake/alert Behavior During Therapy: WFL for tasks assessed/performed Overall Cognitive Status: Within Functional Limits for tasks assessed                                 General Comments: Pt is A and O x 4 and agreeable and cooperative throughout        Exercises Other Exercises Other Exercises: Pt educated re: use of DME/AE for increased independence in ADL, caregiver education, home setup modifications and posterior THP. Pt and husband receptive and engaged throughout.   Shoulder Instructions       General Comments      Pertinent Vitals/ Pain       Pain Assessment: 0-10 Pain Score: 2  (at start of session) Faces Pain Scale: Hurts little more (in the midst of ambulation) Pain Location: L hip with movement Pain Descriptors / Indicators: Operative site guarding;Discomfort;Grimacing Pain Intervention(s): Limited activity within patient's tolerance;Monitored during session;Repositioned;Ice applied  Home Living                                          Prior Functioning/Environment              Frequency           Progress Toward Goals  OT Goals(current goals can now be found in the care plan section)  Progress towards OT goals: Goals met/education completed, patient discharged from OT  Acute Rehab OT Goals Patient Stated Goal: to return to PLOF OT Goal Formulation: With patient Time For Goal Achievement: 12/11/19 Potential to Achieve Goals: Good  Plan Discharge plan remains appropriate;All goals met and education completed, patient discharged from OT services    Co-evaluation               AM-PAC OT "6 Clicks" Daily Activity     Outcome Measure   Help  from another person eating meals?: None Help from another person taking care of personal grooming?: None Help from another person toileting, which includes using toliet, bedpan, or urinal?: None Help from another person bathing (including washing, rinsing, drying)?: A Lot Help from another person to put on and taking off regular upper body clothing?: None Help from another person to put on and taking off regular lower body clothing?: None 6 Click Score: 22    End of Session Equipment Utilized During Treatment: Gait belt;Rolling walker  OT Visit Diagnosis: Other abnormalities of gait and mobility (R26.89);Pain Pain - Right/Left: Left Pain - part of body: Hip   Activity Tolerance Patient tolerated treatment well   Patient Left in chair;with call bell/phone within reach;with family/visitor present (chair alarm beeping continuously, RN notified)   Nurse Communication Other (comment) (chair alarm)        Time: 3614-4315 OT Time Calculation (min): 25 min  Charges: OT General Charges $OT Visit: 1 Visit OT Treatments $Self Care/Home Management : 23-37 mins  Jerilynn Birkenhead, OTS 11/28/19, 10:52 AM

## 2020-09-27 ENCOUNTER — Other Ambulatory Visit: Payer: Self-pay | Admitting: Family Medicine

## 2020-09-27 DIAGNOSIS — Z1231 Encounter for screening mammogram for malignant neoplasm of breast: Secondary | ICD-10-CM

## 2020-09-28 ENCOUNTER — Other Ambulatory Visit: Payer: Self-pay

## 2020-09-28 ENCOUNTER — Ambulatory Visit
Admission: RE | Admit: 2020-09-28 | Discharge: 2020-09-28 | Disposition: A | Discharge status: Home / Self Care | Payer: Medicare Other | Source: Ambulatory Visit | Attending: Family Medicine | Admitting: Family Medicine

## 2020-09-28 DIAGNOSIS — Z1231 Encounter for screening mammogram for malignant neoplasm of breast: Secondary | ICD-10-CM | POA: Insufficient documentation

## 2020-10-08 ENCOUNTER — Ambulatory Visit: Payer: Self-pay | Admitting: Urology

## 2020-11-01 ENCOUNTER — Ambulatory Visit: Payer: Medicare Other | Admitting: Urology

## 2020-11-05 ENCOUNTER — Other Ambulatory Visit: Payer: Self-pay

## 2020-11-05 ENCOUNTER — Ambulatory Visit (INDEPENDENT_AMBULATORY_CARE_PROVIDER_SITE_OTHER): Payer: Medicare Other | Admitting: Urology

## 2020-11-05 ENCOUNTER — Encounter: Payer: Self-pay | Admitting: Urology

## 2020-11-05 VITALS — BP 108/67 | HR 76 | Ht 65.0 in | Wt 139.0 lb

## 2020-11-05 DIAGNOSIS — R3129 Other microscopic hematuria: Secondary | ICD-10-CM

## 2020-11-05 DIAGNOSIS — Z85528 Personal history of other malignant neoplasm of kidney: Secondary | ICD-10-CM

## 2020-11-05 DIAGNOSIS — N390 Urinary tract infection, site not specified: Secondary | ICD-10-CM | POA: Diagnosis not present

## 2020-11-05 DIAGNOSIS — N281 Cyst of kidney, acquired: Secondary | ICD-10-CM | POA: Diagnosis not present

## 2020-11-05 LAB — URINALYSIS, COMPLETE
Bilirubin, UA: NEGATIVE
Glucose, UA: NEGATIVE
Ketones, UA: NEGATIVE
Leukocytes,UA: NEGATIVE
Nitrite, UA: NEGATIVE
Protein,UA: NEGATIVE
Specific Gravity, UA: 1.02 (ref 1.005–1.030)
Urobilinogen, Ur: 1 mg/dL (ref 0.2–1.0)
pH, UA: 6 (ref 5.0–7.5)

## 2020-11-05 LAB — MICROSCOPIC EXAMINATION
Bacteria, UA: NONE SEEN
Epithelial Cells (non renal): NONE SEEN /hpf (ref 0–10)
WBC, UA: NONE SEEN /hpf (ref 0–5)

## 2020-11-05 NOTE — Progress Notes (Signed)
11/05/2020 8:47 AM   Carolyn Frye 12/22/1940 [id number]  Referring provider: Dion Body, MD [facility] [place],   [id number]  Chief Complaint  Patient presents with  . Nephrolithiasis    Urologic history: 1.Recurrent lower tract UTI -Previously on postcoital prophylaxis  2.History renal cell carcinoma -Status post hand-assisted laparoscopic right nephrectomy 2006  3.Asymptomatic microhematuria -Long history  HPI: 80 y.o. female presents for annual follow-up.   No problems since last years visit and denies recurrent UTI  No dysuria or gross hematuria  Denies flank, abdominal or pelvic pain  Renal ultrasound performed last year showed a stable and slightly smaller left renal cyst  Creatinine 08/2020 0.8   PMH: Past Medical History:  Diagnosis Date  . Arthritis    hands, feet  . Cancer [facility]) 2007   kidney right   . Cancer ([place]) 2014   melanoma  . Cellulitis    face  . Chronic kidney disease    kidney cancer  . Diverticulosis   . Dysrhythmia    several years ago  . GERD (gastroesophageal reflux disease)   . History of kidney stones   . Hypercholesteremia   . Hypertension   . MRSA (methicillin resistant Staphylococcus aureus) 03/03/2009   foot wound - treated  . Osteoporosis   . Seasonal allergies   . Wears contact lenses   . Wears dentures    partial bottom    Surgical History: Past Surgical History:  Procedure Laterality Date  . ABDOMINAL HYSTERECTOMY    . BREAST BIOPSY Left 1985   neg  . BREAST CYST ASPIRATION Left 1993   lt fna-neg  . CATARACT EXTRACTION W/PHACO Left 02/03/2015   Procedure: CATARACT EXTRACTION PHACO AND INTRAOCULAR LENS PLACEMENT (IOC);  Surgeon: Leandrew Koyanagi, MD;  Location: [place];  Service: Ophthalmology;  Laterality: Left;  . CATARACT EXTRACTION W/PHACO Right 03/10/2015   Procedure: CATARACT EXTRACTION  PHACO AND INTRAOCULAR LENS PLACEMENT (IOC);  Surgeon: Leandrew Koyanagi, MD;  Location: [place];  Service: Ophthalmology;  Laterality: Right;  . CHOLECYSTECTOMY    . COLONOSCOPY    . FOOT SURGERY     for MRSA  . NEPHRECTOMY Right 2007  . SKIN CANCER EXCISION    . TOTAL HIP ARTHROPLASTY Left 11/26/2019   Procedure: TOTAL HIP ARTHROPLASTY;  Surgeon: Dereck Leep, MD;  Location: [facility];  Service: Orthopedics;  Laterality: Left;  Marland Kitchen VEIN LIGATION Right     Home Medications:  Allergies as of 11/05/2020      Reactions   Ciprofloxacin Other (See Comments)   Shoulder pain, tendon tear   Sulfa Antibiotics Other (See Comments)   unknown   Atorvastatin Other (See Comments)   Joint aches   Dilaudid [hydromorphone] Other (See Comments)   Severe headache   Hydralazine Palpitations   Levaquin [levofloxacin] Anxiety   Macrobid [nitrofurantoin] Other (See Comments)   Red, burning eyes      Medication List       Accurate as of Nov 05, 2020  8:47 AM. If you have any questions, ask your nurse or doctor.        acetaminophen 500 MG tablet Commonly known as: TYLENOL Take 1,000 mg by mouth every 6 (six) hours as needed for moderate pain.   amLODipine 10 MG tablet Commonly known as: NORVASC Take 10 mg by mouth daily.   cholecalciferol 25 MCG (1000 UNIT) tablet Commonly known as: VITAMIN D3 Take 1,000 Units by mouth daily.   clobetasol ointment  0.05 % Commonly known as: TEMOVATE Apply 1 application topically 2 (two) times daily.   enoxaparin 40 MG/0.4ML injection Commonly known as: LOVENOX Inject 0.4 mLs (40 mg total) into the skin daily for 14 days.   hydrochlorothiazide 12.5 MG tablet Commonly known as: HYDRODIURIL Take 12.5 mg by mouth daily.   labetalol 100 MG tablet Commonly known as: NORMODYNE Take 100 mg by mouth 2 (two) times daily.   omeprazole 20 MG capsule Commonly known as: PRILOSEC Take 20 mg by mouth daily.   oxyCODONE 5 MG immediate release  tablet Commonly known as: Oxy IR/ROXICODONE Take 1 tablet (5 mg total) by mouth every 4 (four) hours as needed for moderate pain (pain score 4-6).   rosuvastatin 10 MG tablet Commonly known as: CRESTOR Take 10 mg by mouth daily. AM   traMADol 50 MG tablet Commonly known as: ULTRAM Take 1 tablet (50 mg total) by mouth every 4 (four) hours as needed for moderate pain.       Allergies:  Allergies  Allergen Reactions  . Ciprofloxacin Other (See Comments)    Shoulder pain, tendon tear  . Sulfa Antibiotics Other (See Comments)    unknown  . Atorvastatin Other (See Comments)    Joint aches  . Dilaudid [Hydromorphone] Other (See Comments)    Severe headache  . Hydralazine Palpitations  . Levaquin [Levofloxacin] Anxiety  . Macrobid [Nitrofurantoin] Other (See Comments)    Red, burning eyes    Family History: Family History  Problem Relation Age of Onset  . Breast cancer Neg Hx   . Bladder Cancer Neg Hx   . Kidney cancer Neg Hx     Social History:  reports that she has quit smoking. She has never used smokeless tobacco. She reports that she does not drink alcohol and does not use drugs.   Physical Exam: BP 108/67   Pulse 76   Ht 5\' 5"  (1.651 m)   Wt 139 lb (63 kg)   BMI 23.13 kg/m   Constitutional:  Alert and oriented, No acute distress. HEENT: [place] AT, moist mucus membranes.  Trachea midline, no masses. Cardiovascular: No clubbing, cyanosis, or edema. Respiratory: Normal respiratory effort, no increased work of breathing.   Laboratory Data:  Urinalysis Dipstick trace intact blood Microscopy negative  Pertinent Imaging:   Ultrasound renal complete  Narrative CLINICAL DATA:  Renal cyst, history of renal cell carcinoma.  EXAM: RENAL / URINARY TRACT ULTRASOUND COMPLETE  COMPARISON:  Renal ultrasound 06/25/2017, no interval imaging.  FINDINGS: Right Kidney:  Surgically absent. No obvious sonographic abnormality in the right renal bed.  Left  Kidney:  Renal measurements: 11.7 x 5.6 x 6.8 cm = volume: 234 mL. Cyst in the lower pole measures 1.2 x 1.0 x 1.0 cm and contains internal septation. The adjacent 2 mm nodular focus on prior exam is not as well demonstrated currently, but grossly stable. No new renal lesion. No hydronephrosis.  Bladder:  Appears normal for degree of bladder distention.  Other:  None.  IMPRESSION: 1. Septated left lower pole renal cyst measuring 1.2 cm, unchanged or slightly smaller than on 2019 exam. No new renal lesions or acute findings. 2. Post right nephrectomy.   Electronically Signed By: Keith Rake M.D. On: 10/21/2019 15:58   Assessment & Plan:    1.  Recurrent UTI  No recent infections  2.  History of renal cell carcinoma  No further routine surveillance needed  3.  Chronic microhematuria  Urinalysis today clear  4.  Bosniak 2  renal cyst  No follow-up required for a Bosniak 2 cyst however she would like periodic surveillance and will schedule a follow-up renal ultrasound in 1 year    Abbie Sons, [place] [address] [place], [place] [address] [phone]

## 2020-12-07 ENCOUNTER — Other Ambulatory Visit: Payer: Self-pay

## 2020-12-07 ENCOUNTER — Ambulatory Visit
Admission: RE | Admit: 2020-12-07 | Discharge: 2020-12-07 | Disposition: A | Discharge status: Home / Self Care | Payer: Medicare Other | Source: Ambulatory Visit | Attending: Family Medicine | Admitting: Family Medicine

## 2020-12-07 DIAGNOSIS — R600 Localized edema: Secondary | ICD-10-CM | POA: Insufficient documentation

## 2021-05-31 ENCOUNTER — Encounter: Payer: Self-pay | Admitting: Urology

## 2021-11-07 ENCOUNTER — Ambulatory Visit: Payer: Self-pay | Admitting: Urology

## 2021-11-07 ENCOUNTER — Telehealth: Payer: Self-pay | Admitting: *Deleted

## 2021-11-07 DIAGNOSIS — N281 Cyst of kidney, acquired: Secondary | ICD-10-CM

## 2021-11-07 NOTE — Telephone Encounter (Signed)
Need order for renal u/s

## 2021-11-08 NOTE — Addendum Note (Signed)
Addended by: Abbie Sons on: 11/08/2021 07:52 AM   Modules accepted: Orders

## 2021-11-09 ENCOUNTER — Ambulatory Visit: Payer: Medicare Other | Admitting: Urology

## 2023-11-29 ENCOUNTER — Ambulatory Visit (INDEPENDENT_AMBULATORY_CARE_PROVIDER_SITE_OTHER): Admitting: Physician Assistant

## 2023-11-29 VITALS — BP 151/71 | HR 92 | Ht 65.0 in | Wt 139.0 lb

## 2023-11-29 DIAGNOSIS — N390 Urinary tract infection, site not specified: Secondary | ICD-10-CM | POA: Diagnosis not present

## 2023-11-29 LAB — MICROSCOPIC EXAMINATION

## 2023-11-29 NOTE — Progress Notes (Signed)
 11/29/2023 3:18 PM   Carolyn Frye 1940-08-19 [id number]  CC: Chief Complaint  Patient presents with   complex renal cyst   HPI: Carolyn Frye is a 83 y.o. female with PMH recurrent UTI previously on postcoital prophylaxis, RCC s/p hand-assisted laparoscopic right radical nephrectomy in 2006, and asymptomatic microscopic hematuria who presents today for evaluation of recurrent vs persistent UTI.   Today she reports Dr. Jerone Moorman treated her for 2 back-to-back UTIs last month.  Urine cultures grew pansensitive E. coli both times.  She was first treated with Ceftin  x 7 days, then Keflex  x 7 days.  When she developed some bladder pressure and discomfort after completing Keflex , she was put on low-dose daily Bactrim , but then she developed an allergic reaction to this.  She has been off the Bactrim  for about 7 days and her pressure has largely resolved today.  No gross hematuria.  In-office UA today positive for trace intact blood; urine microscopy with >10 epithelial cells/hpf.   PMH: Past Medical History:  Diagnosis Date   Arthritis    hands, feet   Cancer ([facility]) 2007   kidney right    Cancer [facility]) 2014   melanoma   Cellulitis    face   Chronic kidney disease    kidney cancer   Diverticulosis    Dysrhythmia    several years ago   GERD (gastroesophageal reflux disease)    History of kidney stones    Hypercholesteremia    Hypertension    MRSA (methicillin resistant Staphylococcus aureus) 03/03/2009   foot wound - treated   Osteoporosis    Seasonal allergies    Wears contact lenses    Wears dentures    partial bottom    Surgical History: Past Surgical History:  Procedure Laterality Date   ABDOMINAL HYSTERECTOMY     BREAST BIOPSY Left 1985   neg   BREAST CYST ASPIRATION Left 1993   lt fna-neg   CATARACT EXTRACTION W/PHACO Left 02/03/2015   Procedure: CATARACT EXTRACTION PHACO AND INTRAOCULAR LENS PLACEMENT (IOC);  Surgeon: Annell Kidney, MD;   Location: [facility];  Service: Ophthalmology;  Laterality: Left;   CATARACT EXTRACTION W/PHACO Right 03/10/2015   Procedure: CATARACT EXTRACTION PHACO AND INTRAOCULAR LENS PLACEMENT (IOC);  Surgeon: Annell Kidney, MD;  Location: [facility];  Service: Ophthalmology;  Laterality: Right;   CHOLECYSTECTOMY     COLONOSCOPY     FOOT SURGERY     for MRSA   NEPHRECTOMY Right 2007   SKIN CANCER EXCISION     TOTAL HIP ARTHROPLASTY Left 11/26/2019   Procedure: TOTAL HIP ARTHROPLASTY;  Surgeon: Arlyne Lame, MD;  Location: [facility];  Service: Orthopedics;  Laterality: Left;   VEIN LIGATION Right     Home Medications:  Allergies as of 11/29/2023       Reactions   Ciprofloxacin Other (See Comments)   Shoulder pain, tendon tear   Sulfa  Antibiotics Other (See Comments)   unknown   Atorvastatin Other (See Comments)   Joint aches   Dilaudid  [hydromorphone ] Other (See Comments)   Severe headache   Hydralazine  Palpitations   Levaquin [levofloxacin] Anxiety   Macrobid  [nitrofurantoin ] Other (See Comments)   Red, burning eyes        Medication List        Accurate as of November 29, 2023  3:18 PM. If you have any questions, ask your nurse or doctor.          acetaminophen  500 MG tablet Commonly known  as: TYLENOL  Take 1,000 mg by mouth every 6 (six) hours as needed for moderate pain.   amLODipine  10 MG tablet Commonly known as: NORVASC  Take 10 mg by mouth daily.   cholecalciferol  25 MCG (1000 UNIT) tablet Commonly known as: VITAMIN D3 Take 1,000 Units by mouth daily.   clobetasol  ointment 0.05 % Commonly known as: TEMOVATE  Apply 1 application topically 2 (two) times daily.   enoxaparin  40 MG/0.4ML injection Commonly known as: LOVENOX  Inject 0.4 mLs (40 mg total) into the skin daily for 14 days.   hydrochlorothiazide  12.5 MG tablet Commonly known as: HYDRODIURIL  Take 12.5 mg by mouth daily.   labetalol  100 MG tablet Commonly known as: NORMODYNE  Take  100 mg by mouth 2 (two) times daily.   omeprazole 20 MG capsule Commonly known as: PRILOSEC Take 20 mg by mouth daily.   oxyCODONE  5 MG immediate release tablet Commonly known as: Oxy IR/ROXICODONE  Take 1 tablet (5 mg total) by mouth every 4 (four) hours as needed for moderate pain (pain score 4-6).   rosuvastatin  10 MG tablet Commonly known as: CRESTOR  Take 10 mg by mouth daily. AM   traMADol  50 MG tablet Commonly known as: ULTRAM  Take 1 tablet (50 mg total) by mouth every 4 (four) hours as needed for moderate pain.        Allergies:  Allergies  Allergen Reactions   Ciprofloxacin Other (See Comments)    Shoulder pain, tendon tear   Sulfa  Antibiotics Other (See Comments)    unknown   Atorvastatin Other (See Comments)    Joint aches   Dilaudid  [Hydromorphone ] Other (See Comments)    Severe headache   Hydralazine  Palpitations   Levaquin [Levofloxacin] Anxiety   Macrobid  [Nitrofurantoin ] Other (See Comments)    Red, burning eyes    Family History: Family History  Problem Relation Age of Onset   Breast cancer Neg Hx    Bladder Cancer Neg Hx    Kidney cancer Neg Hx     Social History:   reports that she has quit smoking. She has never used smokeless tobacco. She reports that she does not drink alcohol and does not use drugs.  Physical Exam: BP (!) 151/71   Pulse 92   Ht 5' 5 (1.651 m)   Wt 139 lb (63 kg)   BMI 23.13 kg/m   Constitutional:  Alert and oriented, no acute distress, nontoxic appearing HEENT: [place], AT Cardiovascular: No clubbing, cyanosis, or edema Respiratory: Normal respiratory effort, no increased work of breathing Skin: No rashes, bruises or suspicious lesions Neurologic: Grossly intact, no focal deficits, moving all 4 extremities Psychiatric: Normal mood and affect  Laboratory Data: Results for orders placed or performed in visit on 11/29/23  Microscopic Examination   Collection Time: 11/29/23  3:03 PM   Urine  Result Value Ref Range    WBC, UA WILL FOLLOW    RBC, Urine WILL FOLLOW    Epithelial Cells (non renal) WILL FOLLOW    Renal Epithel, UA WILL FOLLOW    Casts WILL FOLLOW    Cast Type WILL FOLLOW    Crystals WILL FOLLOW    Crystal Type WILL FOLLOW    Mucus, UA WILL FOLLOW    Bacteria, UA WILL FOLLOW    Yeast, UA WILL FOLLOW    Trichomonas, UA WILL FOLLOW    Urinalysis Comments WILL FOLLOW   Urinalysis, Complete   Collection Time: 11/29/23  3:03 PM  Result Value Ref Range   Specific Gravity, UA 1.010 1.005 - 1.030  pH, UA 6.0 5.0 - 7.5   Color, UA Yellow Yellow   Appearance Ur Clear Clear   Leukocytes,UA Negative Negative   Protein,UA Negative Negative/Trace   Glucose, UA Negative Negative   Ketones, UA Negative Negative   RBC, UA Trace (A) Negative   Bilirubin, UA Negative Negative   Urobilinogen, Ur 0.2 0.2 - 1.0 mg/dL   Nitrite, UA Negative Negative   Microscopic Examination See below:    Assessment & Plan:   1. Recurrent UTI (Primary) Recent recurrent versus persistent UTI with pansensitive E. coli.  Her UA is bland today off antibiotics x 1 week and her symptoms have largely resolved.  Will have her follow-up with us  as needed and consider estrogen cream versus suppressive antibiotics in the future if these become more frequent.  She is in agreement. - Urinalysis, Complete  Return if symptoms worsen or fail to improve.  Kathreen Pare, PA-C  [facility] [place] [address], [address] [place], [place] [address] [phone]

## 2023-11-30 LAB — MICROSCOPIC EXAMINATION: Epithelial Cells (non renal): >10 /HPF — AB (ref 0–10)

## 2023-11-30 LAB — URINALYSIS, COMPLETE
Bilirubin, UA: NEGATIVE
Glucose, UA: NEGATIVE
Ketones, UA: NEGATIVE
Leukocytes,UA: NEGATIVE
Nitrite, UA: NEGATIVE
Protein,UA: NEGATIVE
Specific Gravity, UA: 1.01 (ref 1.005–1.030)
Urobilinogen, Ur: 0.2 mg/dL (ref 0.2–1.0)
pH, UA: 6 (ref 5.0–7.5)

## 2023-12-06 ENCOUNTER — Ambulatory Visit: Payer: Self-pay | Admitting: Physician Assistant

## 2023-12-06 LAB — CULTURE, URINE COMPREHENSIVE

## 2023-12-16 ENCOUNTER — Ambulatory Visit: Admission: EM | Admit: 2023-12-16 | Discharge: 2023-12-16 | Disposition: A | Discharge status: Home / Self Care

## 2023-12-16 ENCOUNTER — Encounter: Payer: Self-pay | Admitting: Emergency Medicine

## 2023-12-16 DIAGNOSIS — L03211 Cellulitis of face: Secondary | ICD-10-CM

## 2023-12-16 NOTE — ED Provider Notes (Signed)
 [facility]    CSN: [id number] Arrival date & time: 12/16/23  9188      History   Chief Complaint Chief Complaint  Patient presents with   Oral Pain   Abscess    HPI Carolyn Frye is a 83 y.o. female.   HPI  83 year old female with past medical history significant for chronic kidney disease, melanoma, facial cellulitis, right renal cell carcinoma, arthritis, hypertension, heart cholesterol, osteoporosis, MRSA, and lower dental partial presents for evaluation of swelling to the right side of her lower lip that began 4 days ago as a small pimple.  The swelling has progressed and now encompasses part of her right cheek.  She began running low-grade fevers last night.  Past Medical History:  Diagnosis Date   Arthritis    hands, feet   Cancer (HCC) 2007   kidney right    Cancer [facility]) 2014   melanoma   Cellulitis    face   Chronic kidney disease    kidney cancer   Diverticulosis    Dysrhythmia    several years ago   GERD (gastroesophageal reflux disease)    History of kidney stones    Hypercholesteremia    Hypertension    MRSA (methicillin resistant Staphylococcus aureus) 03/03/2009   foot wound - treated   Osteoporosis    Seasonal allergies    Wears contact lenses    Wears dentures    partial bottom    Patient Active Problem List   Diagnosis Date Noted   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/26/2019   Renal cyst 10/09/2019   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/28/2019   Barrett's esophagus 09/05/2018   Essential hypertension 09/05/2018   History of renal carcinoma 09/05/2018   Osteoporosis, 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   Pure hypercholesterolemia 09/05/2018   Vitamin D  deficiency 09/05/2018   Psoriasis 03/12/2018   Vaccine counseling 07/09/2017   Recurrent UTI 06/06/2017   Personal history of malignant neoplasm of kidney 06/06/2017   Microhematuria 06/06/2017   Chest pain 09/21/2016   Encounter for general adult medical examination without  abnormal findings 09/04/2016   History of melanoma 08/28/2014   Melanoma (HCC) 04/28/2013    Past Surgical History:  Procedure Laterality Date   ABDOMINAL HYSTERECTOMY     BREAST BIOPSY Left 1985   neg   BREAST CYST ASPIRATION Left 1993   lt fna-neg   CATARACT EXTRACTION W/PHACO Left 02/03/2015   Procedure: CATARACT EXTRACTION PHACO AND INTRAOCULAR LENS PLACEMENT (IOC);  Surgeon: Dene Etienne, MD;  Location: [facility];  Service: Ophthalmology;  Laterality: Left;   CATARACT EXTRACTION W/PHACO Right 03/10/2015   Procedure: CATARACT EXTRACTION PHACO AND INTRAOCULAR LENS PLACEMENT (IOC);  Surgeon: Dene Etienne, MD;  Location: [facility];  Service: Ophthalmology;  Laterality: Right;   CHOLECYSTECTOMY     COLONOSCOPY     FOOT SURGERY     for MRSA   NEPHRECTOMY Right 2007   SKIN CANCER EXCISION     TOTAL HIP ARTHROPLASTY Left 11/26/2019   Procedure: TOTAL HIP ARTHROPLASTY;  Surgeon: Mardee Lynwood SQUIBB, MD;  Location: [facility];  Service: Orthopedics;  Laterality: Left;   VEIN LIGATION Right     OB History   No obstetric history on file.      Home Medications    Prior to Admission medications   Medication Sig Start Date End Date Taking? Authorizing Provider  acetaminophen  (TYLENOL ) 500 MG tablet Take 1,000 mg by mouth every 6 (six) hours as needed for moderate pain.  Yes [provider]  amLODipine  (NORVASC ) 10 MG tablet Take 10 mg by mouth daily.    Yes [provider]  cholecalciferol  (VITAMIN D3) 25 MCG (1000 UNIT) tablet Take 1,000 Units by mouth daily.   Yes [provider]  hydrochlorothiazide  (HYDRODIURIL ) 12.5 MG tablet Take 12.5 mg by mouth daily.    Yes [provider]  labetalol  (NORMODYNE ) 100 MG tablet Take 100 mg by mouth 2 (two) times daily.   Yes [provider]  omeprazole (PRILOSEC) 20 MG capsule Take 20 mg by mouth daily.    Yes [provider]  rosuvastatin  (CRESTOR ) 10 MG tablet  Take 10 mg by mouth daily. AM   Yes [provider]  clobetasol  ointment (TEMOVATE ) 0.05 % Apply 1 application topically 2 (two) times daily.  07/16/18   [provider]  enoxaparin  (LOVENOX ) 40 MG/0.4ML injection Inject 0.4 mLs (40 mg total) into the skin daily for 14 days. 11/28/19 11/29/23  Claudene Morene NOVAK, PA-C  oxyCODONE  (OXY IR/ROXICODONE ) 5 MG immediate release tablet Take 1 tablet (5 mg total) by mouth every 4 (four) hours as needed for moderate pain (pain score 4-6). 11/28/19   Claudene Morene NOVAK, PA-C  traMADol  (ULTRAM ) 50 MG tablet Take 1 tablet (50 mg total) by mouth every 4 (four) hours as needed for moderate pain. 11/28/19   Claudene Morene NOVAK, PA-C    Family History Family History  Problem Relation Age of Onset   Breast cancer Neg Hx    Bladder Cancer Neg Hx    Kidney cancer Neg Hx     Social History Social History   Tobacco Use   Smoking status: Former   Smokeless tobacco: Never   Tobacco comments:    in her 20's  Vaping Use   Vaping status: Never Used  Substance Use Topics   Alcohol use: No   Drug use: Never     Allergies   Ciprofloxacin, Sulfa  antibiotics, Atorvastatin, Dilaudid  [hydromorphone ], Hydralazine , Levaquin [levofloxacin], and Macrobid  [nitrofurantoin ]   Review of Systems Review of Systems  Constitutional:  Positive for fever.  HENT:  Positive for facial swelling.   Skin:  Positive for color change.     Physical Exam Triage Vital Signs ED Triage Vitals  Encounter Vitals Group     BP 12/16/23 0823 139/81     Girls Systolic BP Percentile --      Girls Diastolic BP Percentile --      Boys Systolic BP Percentile --      Boys Diastolic BP Percentile --      Pulse Rate 12/16/23 0823 79     Resp 12/16/23 0823 16     Temp 12/16/23 0823 99 F (37.2 C)     Temp Source 12/16/23 0823 Oral     SpO2 12/16/23 0823 97 %     Weight 12/16/23 0821 139 lb (63 kg)     Height 12/16/23 0821 5' 5 (1.651 m)     Head Circumference --       Peak Flow --      Pain Score 12/16/23 0820 5     Pain Loc --      Pain Education --      Exclude from Growth Chart --    No data found.  Updated Vital Signs BP 139/81 (BP Location: Left Arm)   Pulse 79   Temp 99 F (37.2 C) (Oral)   Resp 16   Ht 5' 5 (1.651 m)   Wt 139 lb (63  kg)   SpO2 97%   BMI 23.13 kg/m   Visual Acuity Right Eye Distance:   Left Eye Distance:   Bilateral Distance:    Right Eye Near:   Left Eye Near:    Bilateral Near:     Physical Exam Vitals and nursing note reviewed.  Constitutional:      Appearance: Normal appearance. She is not ill-appearing.  HENT:     Head: Normocephalic and atraumatic.     Mouth/Throat:     Mouth: Mucous membranes are moist.     Pharynx: Oropharynx is clear. Posterior oropharyngeal erythema present. No oropharyngeal exudate.     Comments: Lower lip is erythematous and edematous with erythema tracking onto the buccal surface of the right cheek as well as extending externally into the right cheek with palpable fluid collection.  Swelling extends into the submandibular space.  Skin:    General: Skin is warm and dry.     Capillary Refill: Capillary refill takes less than 2 seconds.     Findings: Erythema present.   Neurological:     General: No focal deficit present.     Mental Status: She is alert and oriented to person, place, and time.      UC Treatments / Results  Labs (all labs ordered are listed, but only abnormal results are displayed) Labs Reviewed - No data to display  EKG   Radiology No results found.  Procedures Procedures (including critical care time)  Medications Ordered in UC Medications - No data to display  Initial Impression / Assessment and Plan / UC Course  I have reviewed the triage vital signs and the nursing notes.  Pertinent labs & imaging results that were available during my care of the patient were reviewed by me and considered in my medical decision making (see chart for  details).   Patient is a pleasant 83 year old female presenting for evaluation of possible MRSA facial cellulitis and abscess as outlined HPI above.    As you can see in the images above, there is significant swelling as well as erythema on the buccal surface of the lip and right cheek.  Externally there is a palpable fluid collection and edema that extends into the submandibular space.  The patient reports that she was able to get her partial out last night but it was difficult due to the swelling.  Given the rapid progression of this infection, degree of facial swelling, and palpable fluid collection I feel she needs to be evaluated by a dentist or oral surgeon.  Given that it is the weekend I am going to refer her to [facility] where she can be evaluated by either the dentist or oral surgeon on-call.   Final Clinical Impressions(s) / UC Diagnoses   Final diagnoses:  Cellulitis of face     Discharge Instructions      Please go to the emergency department at [facility] to be evaluated by either the oral surgeon or the dentist on-call given that you have cellulitis of your face with an abscess formation.  Do not eat or drink anything until after you have been evaluated in the emergency department.  Please go now.     ED Prescriptions   None    PDMP not reviewed this encounter.   Bernardino Ditch, NP 12/16/23 [phone]

## 2023-12-16 NOTE — Discharge Instructions (Addendum)
 Please go to the emergency department at [facility] to be evaluated by either the oral surgeon or the dentist on-call given that you have cellulitis of your face with an abscess formation.  Do not eat or drink anything until after you have been evaluated in the emergency department.  Please go now.

## 2023-12-16 NOTE — ED Notes (Signed)
 Patient is being discharged from the Urgent Care and sent to the Emergency Department via POV . Per Venetia Motto, NP, patient is in need of higher level of care due to Cellulitis of face. Patient is aware and verbalizes understanding of plan of care.  Vitals:   12/16/23 0823  BP: 139/81  Pulse: 79  Resp: 16  Temp: 99 F (37.2 C)  SpO2: 97%

## 2023-12-16 NOTE — ED Triage Notes (Signed)
 Pt c/o abscess on the right side of her lip and jaw. She has swelling in her her right jaw and states the area is hard. Started about 4 days ago. She states it started as a small bump and has gotten bigger dispute warm compresses. 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 She states she usually gets oral antibiotics for this. She states she had a low grade fever yesterday.
